# Patient Record
Sex: Female | Born: 1985 | Race: White | Hispanic: No | Marital: Single | Smoking: Former smoker
Health system: Southern US, Community
[De-identification: ages and names within clinical notes are randomized; demographics above are authoritative.]

## PROBLEM LIST (undated history)

## (undated) DIAGNOSIS — F419 Anxiety disorder, unspecified: Secondary | ICD-10-CM

---
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
## 2007-11-26 ENCOUNTER — Emergency Department: Payer: Self-pay | Admitting: Unknown Physician Specialty

## 2009-01-02 ENCOUNTER — Ambulatory Visit: Payer: Self-pay | Admitting: Family Medicine

## 2009-05-07 ENCOUNTER — Inpatient Hospital Stay: Payer: Self-pay | Admitting: Obstetrics & Gynecology

## 2011-04-04 ENCOUNTER — Ambulatory Visit: Payer: Self-pay | Admitting: General Surgery

## 2014-11-01 ENCOUNTER — Emergency Department
Admit: 2014-11-01 | Disposition: A | Discharge status: Home / Self Care | Payer: Self-pay | Admitting: Emergency Medicine

## 2014-11-01 LAB — COMPREHENSIVE METABOLIC PANEL
ANION GAP: 7 (ref 7–16)
Albumin: 4.5 g/dL
Alkaline Phosphatase: 46 U/L
BUN: 10 mg/dL
CO2: 23 mmol/L
CREATININE: 0.76 mg/dL
Calcium, Total: 9.2 mg/dL
Chloride: 108 mmol/L
EGFR (African American): >60
EGFR (Non-African Amer.): >60
GLUCOSE: 89 mg/dL
Potassium: 4 mmol/L
SGOT(AST): 19 U/L
SGPT (ALT): 10 U/L — ABNORMAL LOW
SODIUM: 138 mmol/L
Total Protein: 7.2 g/dL

## 2014-11-01 LAB — CBC WITH DIFFERENTIAL/PLATELET
Basophil #: 0 10*3/uL (ref 0.0–0.1)
Basophil %: 0.7 %
Eosinophil #: 0.2 10*3/uL (ref 0.0–0.7)
Eosinophil %: 2.5 %
HCT: 35.7 % (ref 35.0–47.0)
HGB: 11.8 g/dL — ABNORMAL LOW (ref 12.0–16.0)
LYMPHS PCT: 39.4 %
Lymphocyte #: 2.6 10*3/uL (ref 1.0–3.6)
MCH: 27.5 pg (ref 26.0–34.0)
MCHC: 32.9 g/dL (ref 32.0–36.0)
MCV: 84 fL (ref 80–100)
Monocyte #: 0.5 x10 3/mm (ref 0.2–0.9)
Monocyte %: 7.3 %
NEUTROS PCT: 50.1 %
Neutrophil #: 3.3 10*3/uL (ref 1.4–6.5)
Platelet: 176 10*3/uL (ref 150–440)
RBC: 4.27 10*6/uL (ref 3.80–5.20)
RDW: 14.9 % — ABNORMAL HIGH (ref 11.5–14.5)
WBC: 6.5 10*3/uL (ref 3.6–11.0)

## 2014-11-01 LAB — URINALYSIS, COMPLETE
BACTERIA: NONE SEEN
BLOOD: NEGATIVE
Bilirubin,UR: NEGATIVE
GLUCOSE, UR: NEGATIVE mg/dL (ref 0–75)
LEUKOCYTE ESTERASE: NEGATIVE
Nitrite: NEGATIVE
PROTEIN: NEGATIVE
Ph: 5 (ref 4.5–8.0)
Specific Gravity: 1.021 (ref 1.003–1.030)

## 2014-12-21 ENCOUNTER — Emergency Department: Payer: Self-pay

## 2014-12-21 ENCOUNTER — Emergency Department
Admission: EM | Admit: 2014-12-21 | Discharge: 2014-12-21 | Disposition: A | Discharge status: Home / Self Care | Payer: Self-pay | Attending: Emergency Medicine | Admitting: Emergency Medicine

## 2014-12-21 ENCOUNTER — Other Ambulatory Visit: Payer: Self-pay

## 2014-12-21 DIAGNOSIS — Z79899 Other long term (current) drug therapy: Secondary | ICD-10-CM | POA: Insufficient documentation

## 2014-12-21 DIAGNOSIS — Z3202 Encounter for pregnancy test, result negative: Secondary | ICD-10-CM | POA: Insufficient documentation

## 2014-12-21 DIAGNOSIS — G8929 Other chronic pain: Secondary | ICD-10-CM | POA: Insufficient documentation

## 2014-12-21 DIAGNOSIS — E876 Hypokalemia: Secondary | ICD-10-CM | POA: Insufficient documentation

## 2014-12-21 DIAGNOSIS — R1032 Left lower quadrant pain: Secondary | ICD-10-CM | POA: Insufficient documentation

## 2014-12-21 HISTORY — DX: Anxiety disorder, unspecified: F41.9

## 2014-12-21 LAB — COMPREHENSIVE METABOLIC PANEL
ALK PHOS: 55 U/L (ref 38–126)
ALT: 10 U/L — ABNORMAL LOW (ref 14–54)
ANION GAP: 14 (ref 5–15)
AST: 23 U/L (ref 15–41)
Albumin: 4.6 g/dL (ref 3.5–5.0)
BUN: 11 mg/dL (ref 6–20)
CO2: 21 mmol/L — AB (ref 22–32)
Calcium: 9.8 mg/dL (ref 8.9–10.3)
Chloride: 102 mmol/L (ref 101–111)
Creatinine, Ser: 0.76 mg/dL (ref 0.44–1.00)
GFR calc Af Amer: >60 mL/min (ref >60)
Glucose, Bld: 162 mg/dL — ABNORMAL HIGH (ref 65–99)
POTASSIUM: 2.7 mmol/L — AB (ref 3.5–5.1)
Sodium: 137 mmol/L (ref 135–145)
TOTAL PROTEIN: 8.1 g/dL (ref 6.5–8.1)
Total Bilirubin: 0.5 mg/dL (ref 0.3–1.2)

## 2014-12-21 LAB — URINALYSIS COMPLETE WITH MICROSCOPIC (ARMC ONLY)
Bilirubin Urine: NEGATIVE
GLUCOSE, UA: NEGATIVE mg/dL
HGB URINE DIPSTICK: NEGATIVE
Leukocytes, UA: NEGATIVE
NITRITE: NEGATIVE
PROTEIN: NEGATIVE mg/dL
RBC / HPF: NONE SEEN RBC/hpf (ref 0–5)
Specific Gravity, Urine: 1.004 — ABNORMAL LOW (ref 1.005–1.030)
WBC, UA: NONE SEEN WBC/hpf (ref 0–5)
pH: 6 (ref 5.0–8.0)

## 2014-12-21 LAB — CBC WITH DIFFERENTIAL/PLATELET
Basophils Absolute: 0 10*3/uL (ref 0–0.1)
Basophils Relative: 1 %
EOS ABS: 0.1 10*3/uL (ref 0–0.7)
Eosinophils Relative: 2 %
HEMATOCRIT: 34.3 % — AB (ref 35.0–47.0)
HEMOGLOBIN: 11.3 g/dL — AB (ref 12.0–16.0)
Lymphocytes Relative: 39 %
Lymphs Abs: 2.8 10*3/uL (ref 1.0–3.6)
MCH: 27.4 pg (ref 26.0–34.0)
MCHC: 32.9 g/dL (ref 32.0–36.0)
MCV: 83.2 fL (ref 80.0–100.0)
MONO ABS: 0.5 10*3/uL (ref 0.2–0.9)
MONOS PCT: 7 %
Neutro Abs: 3.7 10*3/uL (ref 1.4–6.5)
Neutrophils Relative %: 51 %
PLATELETS: 189 10*3/uL (ref 150–440)
RBC: 4.12 MIL/uL (ref 3.80–5.20)
RDW: 15.8 % — AB (ref 11.5–14.5)
WBC: 7.1 10*3/uL (ref 3.6–11.0)

## 2014-12-21 LAB — LIPASE, BLOOD: Lipase: 33 U/L (ref 22–51)

## 2014-12-21 LAB — POCT PREGNANCY, URINE: Preg Test, Ur: NEGATIVE

## 2014-12-21 MED ORDER — SODIUM CHLORIDE 0.9 % IV BOLUS (SEPSIS)
1000.0000 mL | Freq: Once | INTRAVENOUS | Status: AC
  Administered 2014-12-21: 1000 mL via INTRAVENOUS

## 2014-12-21 MED ORDER — MORPHINE SULFATE 4 MG/ML IJ SOLN: 4.0000 mg | Freq: Once | INTRAMUSCULAR | Status: DC

## 2014-12-21 MED ORDER — ONDANSETRON HCL 4 MG/2ML IJ SOLN
INTRAMUSCULAR | Status: AC
Start: 2014-12-21 — End: 2014-12-21
  Administered 2014-12-21: 4 mg via INTRAVENOUS
  Filled 2014-12-21: qty 2

## 2014-12-21 MED ORDER — MORPHINE SULFATE 4 MG/ML IJ SOLN
4.0000 mg | Freq: Once | INTRAMUSCULAR | Status: AC
  Administered 2014-12-21: 4 mg via INTRAVENOUS

## 2014-12-21 MED ORDER — ONDANSETRON HCL 4 MG/2ML IJ SOLN: 4.0000 mg | Freq: Once | INTRAMUSCULAR | Status: DC

## 2014-12-21 MED ORDER — MORPHINE SULFATE 4 MG/ML IJ SOLN
INTRAMUSCULAR | Status: AC
  Administered 2014-12-21: 4 mg via INTRAVENOUS
  Filled 2014-12-21: qty 1

## 2014-12-21 MED ORDER — POTASSIUM CHLORIDE CRYS ER 20 MEQ PO TBCR
EXTENDED_RELEASE_TABLET | ORAL | Status: AC
  Administered 2014-12-21: 40 meq via ORAL
  Filled 2014-12-21: qty 2

## 2014-12-21 MED ORDER — ONDANSETRON HCL 4 MG/2ML IJ SOLN
4.0000 mg | Freq: Once | INTRAMUSCULAR | Status: AC
  Administered 2014-12-21: 4 mg via INTRAVENOUS

## 2014-12-21 MED ORDER — POTASSIUM CHLORIDE CRYS ER 20 MEQ PO TBCR
40.0000 meq | EXTENDED_RELEASE_TABLET | Freq: Once | ORAL | Status: AC
  Administered 2014-12-21: 40 meq via ORAL

## 2014-12-21 MED ORDER — POTASSIUM CHLORIDE CRYS ER 20 MEQ PO TBCR: 20.0000 meq | EXTENDED_RELEASE_TABLET | Freq: Every day | ORAL | Status: DC

## 2014-12-21 NOTE — Discharge Instructions (Signed)
You were evaluated for left lower abdominal pain for which no certain cause was found however your exam and evaluation are reassuring. We discussed return to the emergency department for fever, vomiting, black or bloody stools, new or worsening abdominal pain. We discussed trying elimination diet including dairy and gluten for 3-4 weeks to see if this gives her any relief. We discussed following up with [facility] clinic as scheduled. We discussed you may need to see a GI specialist, and he may want to try to go to a [facility] clinic due to financial reasons.  Your potassium level was low and you're being given a supplement for 2 more days. You should have your electrolytes and potassium rechecked at your follow-up appointment. Return to the emergency department for any tingling, weakness, chest pain, palpitations, or trouble breathing.  Abdominal Pain Many things can cause abdominal pain. Usually, abdominal pain is not caused by a disease and will improve without treatment. It can often be observed and treated at home. Your health care provider will do a physical exam and possibly order blood tests and X-rays to help determine the seriousness of your pain. However, in many cases, more time must pass before a clear cause of the pain can be found. Before that point, your health care provider may not know if you need more testing or further treatment. HOME CARE INSTRUCTIONS  Monitor your abdominal pain for any changes. The following actions may help to alleviate any discomfort you are experiencing:  Only take over-the-counter or prescription medicines as directed by your health care provider.  Do not take laxatives unless directed to do so by your health care provider.  Try a clear liquid diet (broth, tea, or water) as directed by your health care provider. Slowly move to a bland diet as tolerated. SEEK MEDICAL CARE IF:  You have unexplained abdominal pain.  You have abdominal pain associated with nausea or  diarrhea.  You have pain when you urinate or have a bowel movement.  You experience abdominal pain that wakes you in the night.  You have abdominal pain that is worsened or improved by eating food.  You have abdominal pain that is worsened with eating fatty foods.  You have a fever. SEEK IMMEDIATE MEDICAL CARE IF:   Your pain does not go away within 2 hours.  You keep throwing up (vomiting).  Your pain is felt only in portions of the abdomen, such as the right side or the left lower portion of the abdomen.  You pass bloody or black tarry stools. MAKE SURE YOU:  Understand these instructions.   Will watch your condition.   Will get help right away if you are not doing well or get worse.  Document Released: 04/09/2005 Document Revised: 07/05/2013 Document Reviewed: 03/09/2013 [facility] Patient Information 2015 [place], [place]. This information is not intended to replace advice given to you by your health care provider. Make sure you discuss any questions you have with your health care provider.  Hypokalemia Hypokalemia means that the amount of potassium in the blood is lower than normal.Potassium is a chemical, called an electrolyte, that helps regulate the amount of fluid in the body. It also stimulates muscle contraction and helps nerves function properly.Most of the body's potassium is inside of cells, and only a very small amount is in the blood. Because the amount in the blood is so small, minor changes can be life-threatening. CAUSES  Antibiotics.  Diarrhea or vomiting.  Using laxatives too much, which can cause diarrhea.  Chronic kidney disease.  Water pills (diuretics).  Eating disorders (bulimia).  Low magnesium level.  Sweating a lot. SIGNS AND SYMPTOMS  Weakness.  Constipation.  Fatigue.  Muscle cramps.  Mental confusion.  Skipped heartbeats or irregular heartbeat (palpitations).  Tingling or numbness. DIAGNOSIS  Your health care provider  can diagnose hypokalemia with blood tests. In addition to checking your potassium level, your health care provider may also check other lab tests. TREATMENT Hypokalemia can be treated with potassium supplements taken by mouth or adjustments in your current medicines. If your potassium level is very low, you may need to get potassium through a vein (IV) and be monitored in the hospital. A diet high in potassium is also helpful. Foods high in potassium are:  Nuts, such as peanuts and pistachios.  Seeds, such as sunflower seeds and pumpkin seeds.  Peas, lentils, and lima beans.  Whole grain and bran cereals and breads.  Fresh fruit and vegetables, such as apricots, avocado, bananas, cantaloupe, kiwi, oranges, tomatoes, asparagus, and potatoes.  Orange and tomato juices.  Red meats.  Fruit yogurt. HOME CARE INSTRUCTIONS  Take all medicines as prescribed by your health care provider.  Maintain a healthy diet by including nutritious food, such as fruits, vegetables, nuts, whole grains, and lean meats.  If you are taking a laxative, be sure to follow the directions on the label. SEEK MEDICAL CARE IF:  Your weakness gets worse.  You feel your heart pounding or racing.  You are vomiting or having diarrhea.  You are diabetic and having trouble keeping your blood glucose in the normal range. SEEK IMMEDIATE MEDICAL CARE IF:  You have chest pain, shortness of breath, or dizziness.  You are vomiting or having diarrhea for more than 2 days.  You faint. MAKE SURE YOU:   Understand these instructions.  Will watch your condition.  Will get help right away if you are not doing well or get worse. Document Released: 06/30/2005 Document Revised: 04/20/2013 Document Reviewed: 12/31/2012 [facility] Patient Information 2015 [place], [place]. This information is not intended to replace advice given to you by your health care provider. Make sure you discuss any questions you have with your health  care provider.

## 2014-12-21 NOTE — ED Provider Notes (Addendum)
[facility] Emergency Department Provider Note   ____________________________________________  Time seen: 6:10 PM I have reviewed the triage vital signs and the triage nursing note.  HISTORY  Chief Complaint Abdominal Pain   Historian Patient and mother  HPI Terry Wilkerson is a 29 y.o. female who is complaining of sharp and tingling left lower quadrant pain which started about 30 minutes ago. She has had waxing and waning chronic left sided abdominal pain for about one month and was seen once in the emergency department before and has an appointment scheduled at [facility] clinic.30 minutes prior to arrival today though became sharp and more severe than ever previously. She has no problems with chronic nausea, diarrhea, or constipation.    Past Medical History  Diagnosis Date  . Anxiety     There are no active problems to display for this patient.   Past Surgical History  Procedure Laterality Date  . Cesarean section      Current Outpatient Rx  Name  Route  Sig  Dispense  Refill  . potassium chloride SA (K-DUR,KLOR-CON) 20 MEQ tablet   Oral   Take 1 tablet (20 mEq total) by mouth daily.   2 tablet   0     Allergies Review of patient's allergies indicates no known allergies.  History reviewed. No pertinent family history.  Social History History  Substance Use Topics  . Smoking status: Never Smoker   . Smokeless tobacco: Not on file  . Alcohol Use: No    Review of Systems  Constitutional: Negative for fever. Eyes: Negative for visual changes. ENT: Negative for sore throat. Cardiovascular: Negative for chest pain. Respiratory: Negative for shortness of breath. Gastrointestinal: Negative for vomiting and diarrhea. Genitourinary: Negative for dysuria. Musculoskeletal: Negative for back pain. Skin: Negative for rash. Neurological: Negative for headaches, focal weakness or  numbness.  ____________________________________________   PHYSICAL EXAM:  VITAL SIGNS: ED Triage Vitals  Enc Vitals Group     BP --      Pulse --      Resp --      Temp --      Temp src --      SpO2 --      Weight --      Height --      Head Cir --      Peak Flow --      Pain Score 12/21/14 1808 10     Pain Loc --      Pain Edu? --      Excl. in GC? --      Constitutional: Alert and oriented. Shaking and tearful due to acute pain Eyes: Conjunctivae are normal. PERRL. Normal extraocular movements. ENT   Head: Normocephalic and atraumatic.   Nose: No congestion/rhinnorhea.   Mouth/Throat: Mucous membranes are moist.   Neck: No stridor. Cardiovascular: Normal rate, regular rhythm.  No murmurs, rubs, or gallops. Respiratory: Normal respiratory effort without tachypnea nor retractions. Breath sounds are clear and equal bilaterally. No wheezes/rales/rhonchi. Gastrointestinal: Soft. No distention, no guarding, no rebound. Mild to moderate tenderness in the left lower quadrant on palpation  Genitourinary/rectal: Deferred Musculoskeletal: Nontender with normal range of motion in all extremities. No joint effusions.  No lower extremity tenderness nor edema. Neurologic:  Normal speech and language. No gross focal neurologic deficits are appreciated. Skin:  Skin is warm, dry and intact. No rash noted. Psychiatric: Mood and affect are normal. Speech and behavior are normal. Patient exhibits appropriate insight and judgment.  ____________________________________________  EKG I, Governor Rooks, MD, the attending physician have personally viewed and interpreted this ECG.   97 bpm. Normal sinus rhythm. Narrow QS. Normal axis. Nonspecific T wave. ____________________________________________  LABS (pertinent positives/negatives)  Urine pregnancy test negative Lipase 33 Metabolic panel showing glucose 162, CO2 21, potassium 2.7 White blood count 7.1 and hemoglobin  11.3 Urinalysis: Trace ketones, rare bacteria with no red blood cells white blood cells or leukocytes. ____________________________________________  RADIOLOGY Radiologist results reviewed  Ultrasound non-OB transvaginal and pelvis: IMPRESSION: Negative for ovarian torsion. Normal physiologic appearance of the uterus and ovaries. __________________________________________  PROCEDURES  Procedure(s) performed: None Critical Care performed: None  ____________________________________________   ED COURSE / ASSESSMENT AND PLAN  Pertinent labs & imaging results that were available during my care of the patient were reviewed by me and considered in my medical decision making (see chart for details).  Patient's abdominal pain was much improved after pain medication. She was treated for hypokalemia. On examination of her abdomen, everything is soft, did not feel any masses. I do not see any indication for emergency CT evaluation of the abdomen and I discussed this with the family. They're comfortable with this plan. There have and a follow-up plan at the [facility]. We discussed symptoms are likely related to the bowels, and we discussed perhaps trying limitation diet while waiting for her follow-up appointment. We also discussed due to financial reasons getting into a GI specialist here can be cost prohibitive, and so there going to look into going to [facility] clinics. No abnormal vaginal bleeding or vaginal discharge or pelvic pain, pelvic exam was deferred.   ___________________________________________   FINAL CLINICAL IMPRESSION(S) / ED DIAGNOSES   Final diagnoses:  LLQ pain  Hypokalemia      Governor Rooks, MD 12/21/14 2121  Governor Rooks, MD 12/21/14 2122

## 2014-12-21 NOTE — ED Notes (Signed)
Developed lower abd pain about 30 mins PTA  Pt is very anxious

## 2014-12-21 NOTE — ED Notes (Signed)
[place] notified of negative pregnancy results.

## 2014-12-21 NOTE — ED Notes (Signed)
Dr. Shaune Pollack notified of potassium result.

## 2016-02-04 ENCOUNTER — Emergency Department
Admission: EM | Admit: 2016-02-04 | Discharge: 2016-02-04 | Disposition: A | Discharge status: Home / Self Care | Payer: Self-pay | Attending: Emergency Medicine | Admitting: Emergency Medicine

## 2016-02-04 ENCOUNTER — Emergency Department: Payer: Self-pay

## 2016-02-04 ENCOUNTER — Encounter: Payer: Self-pay | Admitting: Emergency Medicine

## 2016-02-04 DIAGNOSIS — R079 Chest pain, unspecified: Secondary | ICD-10-CM

## 2016-02-04 DIAGNOSIS — F419 Anxiety disorder, unspecified: Secondary | ICD-10-CM | POA: Insufficient documentation

## 2016-02-04 LAB — CBC
HCT: 38.8 % (ref 35.0–47.0)
Hemoglobin: 12.6 g/dL (ref 12.0–16.0)
MCH: 27.6 pg (ref 26.0–34.0)
MCHC: 32.5 g/dL (ref 32.0–36.0)
MCV: 84.8 fL (ref 80.0–100.0)
PLATELETS: 186 10*3/uL (ref 150–440)
RBC: 4.58 MIL/uL (ref 3.80–5.20)
RDW: 15.2 % — ABNORMAL HIGH (ref 11.5–14.5)
WBC: 4.9 10*3/uL (ref 3.6–11.0)

## 2016-02-04 LAB — BASIC METABOLIC PANEL
Anion gap: 9 (ref 5–15)
BUN: 13 mg/dL (ref 6–20)
CHLORIDE: 105 mmol/L (ref 101–111)
CO2: 25 mmol/L (ref 22–32)
CREATININE: 0.61 mg/dL (ref 0.44–1.00)
Calcium: 9.4 mg/dL (ref 8.9–10.3)
GFR calc non Af Amer: >60 mL/min (ref >60)
Glucose, Bld: 94 mg/dL (ref 65–99)
POTASSIUM: 3.6 mmol/L (ref 3.5–5.1)
SODIUM: 139 mmol/L (ref 135–145)

## 2016-02-04 LAB — TROPONIN I: Troponin I: <0.03 ng/mL (ref <0.03)

## 2016-02-04 NOTE — Discharge Instructions (Signed)
You were evaluated for chest discomfort, and although no certain cause was found, your exam and evaluation are reassuring today in the emergency department.  I discussed, I suspect that anxiety may be playing a significant role.  Return to the emergency department for any new or worsening condition including trouble breathing, shortness of breath, fever, coughing, new or worsening chest pain, dizziness or passing out, or any other symptoms concerning to you.

## 2016-02-04 NOTE — ED Triage Notes (Signed)
Pt c/o chest pain under left breast for one week, worse when she takes a deep breath.

## 2016-02-04 NOTE — ED Provider Notes (Signed)
[facility] Emergency Department Provider Note   ____________________________________________  Time seen:  I have reviewed the triage vital signs and the triage nursing note.  HISTORY  Chief Complaint Chest Pain   Historian Patient  HPI Terry Wilkerson is a 30 y.o. female with a history of anxiety is here with about 3 days worth of left sided chest pain that is somewhat sharp and located under the left breast.  No shortness of breath. No pleuritic component to the chest discomfort. Patient states that she initially thought it was probably anxiety, but because it persisted, she started getting a little bit more concerned and wanted to get checked out.  No nausea or sweats. No fevers. No abdominal pain. Symptoms are currently mild. Nothing seems to make it worse or better.    Past Medical History:  Diagnosis Date  . Anxiety     There are no active problems to display for this patient.   Past Surgical History:  Procedure Laterality Date  . CESAREAN SECTION      Current Outpatient Rx  . Order #: [id number] Class: Print    Allergies Ibuprofen  No family history on file.  Social History Social History  Substance Use Topics  . Smoking status: Never Smoker  . Smokeless tobacco: Never Used  . Alcohol use No    Review of Systems  Constitutional: Negative for fever. Eyes: Negative for visual changes. ENT: Negative for sore throat. Cardiovascular: No palpitations Respiratory: Negative for shortness of breath. Gastrointestinal: Negative for abdominal pain, vomiting and diarrhea. Genitourinary: Negative for dysuria. Musculoskeletal: Negative for back pain. Skin: Negative for rash. Neurological: Negative for headache. 10 point Review of Systems otherwise negative ____________________________________________   PHYSICAL EXAM:  VITAL SIGNS: ED Triage Vitals  Enc Vitals Group     BP 02/04/16 1150 124/85     Pulse Rate 02/04/16 1150 (!) 104      Resp 02/04/16 1150 20     Temp 02/04/16 1150 98.4 F (36.9 C)     Temp Source 02/04/16 1150 Oral     SpO2 02/04/16 1150 100 %     Weight 02/04/16 1150 114 lb (51.7 kg)     Height 02/04/16 1150  (1.6 m)     Head Circumference --      Peak Flow --      Pain Score 02/04/16 1157 6     Pain Loc --      Pain Edu? --      Excl. in GC? --      Constitutional: Alert and oriented. Well appearing and in no distress. HEENT   Head: Normocephalic and atraumatic.      Eyes: Conjunctivae are normal. PERRL. Normal extraocular movements.      Ears:         Nose: No congestion/rhinnorhea.   Mouth/Throat: Mucous membranes are moist.   Neck: No stridor. Cardiovascular/Chest: Normal rate, regular rhythm.  No murmurs, rubs, or gallops. Respiratory: Normal respiratory effort without tachypnea nor retractions. Breath sounds are clear and equal bilaterally. No wheezes/rales/rhonchi. Gastrointestinal: Soft. No distention, no guarding, no rebound. Nontender.    Genitourinary/rectal:Deferred Musculoskeletal: Nontender with normal range of motion in all extremities. No joint effusions.  No lower extremity tenderness.  No edema. Neurologic:  Normal speech and language. No gross or focal neurologic deficits are appreciated. Skin:  Skin is warm, dry and intact. No rash noted. Psychiatric: Mood and affect are normal. Speech and behavior are normal. Patient exhibits appropriate insight and judgment.  ____________________________________________  EKG I, Governor Rooks, MD, the attending physician have personally viewed and interpreted all ECGs.  93 bpm. Normal sinus rhythm. Narrow QRS. Normal axis. Nonspecific ST and T-wave ____________________________________________  LABS (pertinent positives/negatives)  Labs Reviewed  CBC - Abnormal; Notable for the following:       Result Value   RDW 15.2 (*)    All other components within normal limits  BASIC METABOLIC PANEL  TROPONIN I     ____________________________________________  RADIOLOGY All Xrays were viewed by me. Imaging interpreted by Radiologist.  Chest: IMPRESSION: No edema or consolidation. Electronically Signed   By: Bretta Bang III M.D __________________________________________  PROCEDURES  Procedure(s) performed: None  Critical Care performed: None  ____________________________________________   ED COURSE / ASSESSMENT AND PLAN  Pertinent labs & imaging results that were available during my care of the patient were reviewed by me and considered in my medical decision making (see chart for details).   This is a well-appearing young patient with symptoms of nonspecific chest discomfort that did not sound convincing for ACS, pulmonary emergency including PE, or abdominal emergency.    Her EKG and labs are reassuring. She has had symptoms ongoing for a couple of days now and her initial troponin is negative. I don't think she needs a repeat troponin.  Patient reports that she does have anxiety, and I will go ahead and refer her both to see primary care physician as well as mental health evaluation.  She does not have an emergency indication for emergency psychiatric evaluation or involuntary commitment.    CONSULTATIONS:   None   Patient / Family / Caregiver informed of clinical course, medical decision-making process, and agree with plan.   I discussed return precautions, follow-up instructions, and discharged instructions with patient and/or family.   ___________________________________________   FINAL CLINICAL IMPRESSION(S) / ED DIAGNOSES   Final diagnoses:  Nonspecific chest pain  Anxiety              Note: This dictation was prepared with Dragon dictation. Any transcriptional errors that result from this process are unintentional    Governor Rooks, MD 02/04/16 1428

## 2016-04-30 ENCOUNTER — Emergency Department
Admission: EM | Admit: 2016-04-30 | Discharge: 2016-04-30 | Disposition: A | Discharge status: Home / Self Care | Payer: Self-pay | Attending: Emergency Medicine | Admitting: Emergency Medicine

## 2016-04-30 ENCOUNTER — Emergency Department: Payer: Self-pay

## 2016-04-30 ENCOUNTER — Encounter: Payer: Self-pay | Admitting: *Deleted

## 2016-04-30 DIAGNOSIS — R0789 Other chest pain: Secondary | ICD-10-CM | POA: Insufficient documentation

## 2016-04-30 LAB — CBC
HCT: 38.2 % (ref 35.0–47.0)
Hemoglobin: 13 g/dL (ref 12.0–16.0)
MCH: 29.4 pg (ref 26.0–34.0)
MCHC: 34 g/dL (ref 32.0–36.0)
MCV: 86.4 fL (ref 80.0–100.0)
PLATELETS: 184 10*3/uL (ref 150–440)
RBC: 4.42 MIL/uL (ref 3.80–5.20)
RDW: 14.7 % — AB (ref 11.5–14.5)
WBC: 4.2 10*3/uL (ref 3.6–11.0)

## 2016-04-30 LAB — BASIC METABOLIC PANEL
Anion gap: 8 (ref 5–15)
BUN: 9 mg/dL (ref 6–20)
CHLORIDE: 104 mmol/L (ref 101–111)
CO2: 26 mmol/L (ref 22–32)
CREATININE: 0.73 mg/dL (ref 0.44–1.00)
Calcium: 9.7 mg/dL (ref 8.9–10.3)
Glucose, Bld: 107 mg/dL — ABNORMAL HIGH (ref 65–99)
POTASSIUM: 3.8 mmol/L (ref 3.5–5.1)
SODIUM: 138 mmol/L (ref 135–145)

## 2016-04-30 LAB — TROPONIN I: Troponin I: <0.03 ng/mL (ref <0.03)

## 2016-04-30 NOTE — ED Notes (Signed)
Chest tightness intermittently X 1 week, worse while lying down. Pt alert and oriented X4, active, cooperative, pt in NAD. RR even and unlabored, color WNL.

## 2016-04-30 NOTE — ED Notes (Signed)
Pt states understanding of discharge instructions. NAD noted at this time.  

## 2016-04-30 NOTE — ED Triage Notes (Signed)
States chest tightness for 1 week, states pressure is worse when lying down, pt awake and alert in no acute distress

## 2016-04-30 NOTE — ED Notes (Signed)
MD at bedside. 

## 2016-04-30 NOTE — ED Provider Notes (Signed)
[facility] Emergency Department Provider Note   ____________________________________________    I have reviewed the triage vital signs and the nursing notes.   HISTORY  Chief Complaint Chest Pain   HPI Terry Wilkerson is a 30 y.o. female who presents with chest discovered. Patient reports she has aching chest discomfort under her left breast which is worse with pressure. She describes it as mild to moderate. She notes it has been occurring over the last 3 months constantly. She denies pleurisy. No shortness of breath. No history of blood clots. No diaphoresis. No radiation of the pain. No injury to the area.   Past Medical History:  Diagnosis Date  . Anxiety     There are no active problems to display for this patient.   Past Surgical History:  Procedure Laterality Date  . CESAREAN SECTION      Prior to Admission medications   Medication Sig Start Date End Date Taking? Authorizing Provider  potassium chloride SA (K-DUR,KLOR-CON) 20 MEQ tablet Take 1 tablet (20 mEq total) by mouth daily. 12/21/14   Governor Rooksebecca Lord, MD     Allergies Ibuprofen  History reviewed. No pertinent family history.  Social History Social History  Substance Use Topics  . Smoking status: Never Smoker  . Smokeless tobacco: Never Used  . Alcohol use No    Review of Systems  Constitutional: No fever/chills    Cardiovascular: As above Respiratory: Denies shortness of breath. Gastrointestinal: No abdominal pain.      Musculoskeletal: Negative for back pain. Skin: Negative for rash. Neurological: Negative for weakness  10-point ROS otherwise negative.  ____________________________________________   PHYSICAL EXAM:  VITAL SIGNS: ED Triage Vitals [04/30/16 1330]  Enc Vitals Group     BP (!) 132/91     Pulse Rate 98     Resp 18     Temp 98.4 F (36.9 C)     Temp Source Oral     SpO2 100 %     Weight 118 lb (53.5 kg)     Height 5\' 3"  (1.6 m)     Head  Circumference      Peak Flow      Pain Score 6     Pain Loc      Pain Edu?      Excl. in GC?     Constitutional: Alert and oriented. No acute distress. Pleasant and interactive Eyes: Conjunctivae are normal.     Cardiovascular: Normal rate, regular rhythm. Grossly normal heart sounds.  Good peripheral circulation. Patient has tenderness to palpation on the lateral inferior border of the sternum, no swelling, erythema, bruising, bony abnormalities Respiratory: Normal respiratory effort.  No retractions. Lungs CTAB. Gastrointestinal: Soft and nontender. No distention.  No CVA tenderness. Genitourinary: deferred Musculoskeletal: No lower extremity tenderness nor edema.  Warm and well perfused Neurologic:  Normal speech and language.  Skin:  Skin is warm, dry and intact. No rash noted. Psychiatric: Mood and affect are normal. Speech and behavior are normal.  ____________________________________________   LABS (all labs ordered are listed, but only abnormal results are displayed)  Labs Reviewed  BASIC METABOLIC PANEL - Abnormal; Notable for the following:       Result Value   Glucose, Bld 107 (*)    All other components within normal limits  CBC - Abnormal; Notable for the following:    RDW 14.7 (*)    All other components within normal limits  TROPONIN I   ____________________________________________  EKG  ED  ECG REPORT I, Jene Every, the attending physician, personally viewed and interpreted this ECG.  Date: 04/30/2016 EKG Time: 1:37 PM Rate: 86 Rhythm: normal sinus rhythm QRS Axis: normal Intervals: normal ST/T Wave abnormalities: normal Conduction Disturbances: none   ____________________________________________  RADIOLOGY  Chest x-ray remarkable ____________________________________________   PROCEDURES  Procedure(s) performed: No    Critical Care performed:No ____________________________________________   INITIAL IMPRESSION / ASSESSMENT AND  PLAN / ED COURSE  Pertinent labs & imaging results that were available during my care of the patient were reviewed by me and considered in my medical decision making (see chart for details).  Patient presents with 3 months of left-sided chest wall tenderness. Her lab work is reassuring, her history of present illness is most consistent with costochondritis versus chest wall pain unspecified. Her chest x-ray is normal. Recommend trial of NSAIDs and follow-up with PCP. Return precautions discussed.  Clinical Course   ____________________________________________   FINAL CLINICAL IMPRESSION(S) / ED DIAGNOSES  Final diagnoses:  Chest wall pain      NEW MEDICATIONS STARTED DURING THIS VISIT:  Discharge Medication List as of 04/30/2016  3:20 PM       Note:  This document was prepared using Dragon voice recognition software and may include unintentional dictation errors.    Jene Every, MD 04/30/16 [id number]

## 2016-05-02 ENCOUNTER — Emergency Department (HOSPITAL_COMMUNITY)
Admission: EM | Admit: 2016-05-02 | Discharge: 2016-05-02 | Disposition: A | Discharge status: Home / Self Care | Payer: Self-pay | Attending: Emergency Medicine | Admitting: Emergency Medicine

## 2016-05-02 ENCOUNTER — Emergency Department (HOSPITAL_COMMUNITY): Payer: Self-pay

## 2016-05-02 ENCOUNTER — Encounter (HOSPITAL_COMMUNITY): Payer: Self-pay

## 2016-05-02 DIAGNOSIS — R0789 Other chest pain: Secondary | ICD-10-CM | POA: Insufficient documentation

## 2016-05-02 LAB — CBC
HEMATOCRIT: 36.8 % (ref 36.0–46.0)
Hemoglobin: 12.3 g/dL (ref 12.0–15.0)
MCH: 28.8 pg (ref 26.0–34.0)
MCHC: 33.4 g/dL (ref 30.0–36.0)
MCV: 86.2 fL (ref 78.0–100.0)
PLATELETS: 217 10*3/uL (ref 150–400)
RBC: 4.27 MIL/uL (ref 3.87–5.11)
RDW: 13.3 % (ref 11.5–15.5)
WBC: 4.9 10*3/uL (ref 4.0–10.5)

## 2016-05-02 LAB — BASIC METABOLIC PANEL
Anion gap: 10 (ref 5–15)
BUN: 8 mg/dL (ref 6–20)
CHLORIDE: 104 mmol/L (ref 101–111)
CO2: 24 mmol/L (ref 22–32)
CREATININE: 0.77 mg/dL (ref 0.44–1.00)
Calcium: 9.7 mg/dL (ref 8.9–10.3)
GFR calc Af Amer: >60 mL/min (ref >60)
GFR calc non Af Amer: >60 mL/min (ref >60)
Glucose, Bld: 137 mg/dL — ABNORMAL HIGH (ref 65–99)
POTASSIUM: 3.3 mmol/L — AB (ref 3.5–5.1)
Sodium: 138 mmol/L (ref 135–145)

## 2016-05-02 LAB — I-STAT BETA HCG BLOOD, ED (MC, WL, AP ONLY)

## 2016-05-02 LAB — I-STAT TROPONIN, ED: Troponin i, poc: 0 ng/mL (ref 0.00–0.08)

## 2016-05-02 LAB — D-DIMER, QUANTITATIVE: D-Dimer, Quant: 0.32 ug/mL-FEU (ref 0.00–0.50)

## 2016-05-02 MED ORDER — ACETAMINOPHEN 500 MG PO TABS
1000.0000 mg | ORAL_TABLET | Freq: Once | ORAL | Status: AC
  Administered 2016-05-02: 1000 mg via ORAL
  Filled 2016-05-02: qty 2

## 2016-05-02 NOTE — ED Notes (Signed)
Pt states chest pain is 10/10.  Pt updated that she is getting ready to go to treatment room.

## 2016-05-02 NOTE — ED Provider Notes (Signed)
[facility]-EMERGENCY DEPT Provider Note   CSN: [id number] Arrival date & time: 05/02/16  1903     History   Chief Complaint Chief Complaint  Patient presents with  . Chest Pain    HPI Terry Wilkerson is a 30 y.o. female.  The history is provided by the patient. No language interpreter was used.  Chest Pain   This is a recurrent problem. The current episode started 6 to 12 hours ago. The problem occurs constantly. The problem has been gradually worsening. The pain is associated with rest. The pain is present in the substernal region. The pain is at a severity of 8/10. The pain is moderate. The quality of the pain is described as vice-like (tightness). The pain radiates to the left arm. Duration of episode(s) is 2 weeks. Exacerbated by: Laying down. Pertinent negatives include no abdominal pain, no cough, no fever, no hemoptysis, no lower extremity edema, no shortness of breath, no vomiting and no weakness. Treatments tried: Moving around, walking. The treatment provided mild relief. There are no known risk factors.  Her past medical history is significant for anxiety/panic attacks.  Pertinent negatives for past medical history include no diabetes, no hyperlipidemia and no hypertension.  Pertinent negatives for family medical history include: no early MI, no PE and no sickle cell disease.  Procedure history is negative for echocardiogram and stress echo.    Past Medical History:  Diagnosis Date  . Anxiety     There are no active problems to display for this patient.   Past Surgical History:  Procedure Laterality Date  . CESAREAN SECTION      OB History    No data available       Home Medications    Prior to Admission medications   Not on File    Family History History reviewed. No pertinent family history.  Social History Social History  Substance Use Topics  . Smoking status: Never Smoker  . Smokeless tobacco: Never Used  . Alcohol use No     Allergies     Ibuprofen   Review of Systems Review of Systems  Constitutional: Negative for fever.  HENT: Negative.   Respiratory: Negative for cough, hemoptysis and shortness of breath.   Cardiovascular: Positive for chest pain.  Gastrointestinal: Negative for abdominal pain and vomiting.  Genitourinary: Negative.   Musculoskeletal: Negative.   Skin: Negative.   Allergic/Immunologic: Negative for immunocompromised state.  Neurological: Negative for weakness.  Hematological: Does not bruise/bleed easily.  Psychiatric/Behavioral: Negative.      Physical Exam Updated Vital Signs BP 118/82   Pulse 108   Temp 98.4 F (36.9 C) (Oral)   Resp 16   Ht 5\' 3"  (1.6 m)   Wt 52.4 kg   LMP 04/27/2016   SpO2 98%   BMI 20.48 kg/m   Physical Exam  Constitutional: She is oriented to person, place, and time. She appears well-developed and well-nourished. No distress.  HENT:  Head: Normocephalic and atraumatic.  Eyes: Conjunctivae and EOM are normal.  Neck: Normal range of motion. Neck supple.  Cardiovascular: Regular rhythm, normal heart sounds and intact distal pulses.  Tachycardia present.  Exam reveals no gallop and no friction rub.   No murmur heard. Pulses:      Radial pulses are 2+ on the right side, and 2+ on the left side.  Pulmonary/Chest: Effort normal and breath sounds normal. No respiratory distress. She has no wheezes. She has no rales. She exhibits tenderness.  Abdominal: Soft. She exhibits no distension  and no mass. There is no tenderness. There is no guarding.  Musculoskeletal: She exhibits no edema.  Neurological: She is alert and oriented to person, place, and time.  Skin: Skin is warm and dry. Capillary refill takes less than 2 seconds. No rash noted. She is not diaphoretic. No erythema. No pallor.  Psychiatric: She has a normal mood and affect. Her behavior is normal. Judgment and thought content normal.     ED Treatments / Results  Labs (all labs ordered are listed, but  only abnormal results are displayed) Labs Reviewed  BASIC METABOLIC PANEL - Abnormal; Notable for the following:       Result Value   Potassium 3.3 (*)    Glucose, Bld 137 (*)    All other components within normal limits  CBC  D-DIMER, QUANTITATIVE (NOT AT [facility])  I-STAT TROPOININ, ED  I-STAT BETA HCG BLOOD, ED ([facility], [facility], [facility] ONLY)  I-STAT TROPOININ, ED    EKG  EKG Interpretation  Date/Time:  Friday May 02 2016 19:07:35 EDT Ventricular Rate:  118 PR Interval:  150 QRS Duration: 72 QT Interval:  320 QTC Calculation: 448 R Axis:   75 Text Interpretation:  Sinus tachycardia Possible Left atrial enlargement Nonspecific ST and T wave abnormality Abnormal ECG Confirmed by Jodi Mourning MD, Ivin Booty ([id number]) on 05/02/2016 9:49:56 PM Also confirmed by Jodi Mourning MD, JOSHUA [phone]), editor [place], Cala Bradford [phone])  on 05/03/2016 9:13:47 AM       Radiology Dg Chest 2 View  Result Date: 05/02/2016 CLINICAL DATA:  Left upper chest pain, 1 month duration. EXAM: CHEST  2 VIEW COMPARISON:  04/30/2016.  02/04/2016. FINDINGS: Heart size is normal. Mediastinal shadows are normal. The lungs are clear. No bronchial thickening. No infiltrate, mass, effusion or collapse. Pulmonary vascularity is normal. No bony abnormality. IMPRESSION: Normal chest Electronically Signed   By: Paulina Fusi M.D.   On: 05/02/2016 19:39    Procedures Procedures (including critical care time)    EMERGENCY DEPARTMENT [place] CARDIAC EXAM "Study: Limited Ultrasound of the heart and pericardium"  INDICATIONS:Tachycardia Multiple views of the heart and pericardium are obtained with a multi-frequency probe.  PERFORMED BJ:YNWGNF  IMAGES ARCHIVED?: Yes  FINDINGS: No pericardial effusion and Normal contractility  VIEWS USED: Subcostal 4 chamber and Parasternal long axis  INTERPRETATION: Cardiac activity present, Pericardial effusioin absent and Normal contractility  COMMENT:  No pericardial effusion   Medications Ordered in  ED Medications  acetaminophen (TYLENOL) tablet 1,000 mg (1,000 mg Oral Given 05/02/16 2153)     Initial Impression / Assessment and Plan / ED Course  I have reviewed the triage vital signs and the nursing notes.  Pertinent labs & imaging results that were available during my care of the patient were reviewed by me and considered in my medical decision making (see chart for details).  Clinical Course    Patient presents with chest tightness that has been intermittent for the last month but worse today for the last six hours. She is otherwise healthy and at very low risk for ACS based on comorbidities, age, gender, lack of family history. EKG reveals sinus tachycardia with no signs of acute ischemic changes. She is overall well-appearing and tachycardic to the low 100s but with otherwise normal vital signs. Labs are unremarkable, no signs of infectious etiology at present given lack of other symptoms. Chest x-ray unremarkable without signs of pneumonia, pneumothorax, cardiomegaly. Bedside echo without pericardial effusion. She is low-risk by [facility] and d-dimer was negative, low concern for VTE. Spot troponin after  6 hours of symptoms was negative, do not suspect myocarditis given this results. Do not feel another troponin is necessary given her extremely low risk for ACS and symptoms present for 6 hours already. She does note a viral illness two weeks ago and her pain is improved on standing, so pericarditis is on the differential. Instructed her to treat with OTC analgesics (tylenol as she is allergic to ibuprofen) and follow up with primary care for continued symptoms. She was given return precautions and expressed understanding. She is in good condition for discharge home.  Final Clinical Impressions(s) / ED Diagnoses   Final diagnoses:  Chest wall pain  Atypical chest pain    New Prescriptions Discharge Medication List as of 05/02/2016 10:59 PM       Preston FleetingAnna Elantra Caprara, MD 05/03/16 1145     Blane OharaJoshua Zavitz, MD 05/03/16 1524

## 2016-05-02 NOTE — ED Triage Notes (Signed)
Pt states intermittent L sided chest pain that radiates to L arm x 1 month. Pt states some tightness. Pt denies any injury/trauma. Pt denies any n/v.

## 2018-09-23 ENCOUNTER — Emergency Department: Payer: Self-pay

## 2018-09-23 ENCOUNTER — Other Ambulatory Visit: Payer: Self-pay

## 2018-09-23 ENCOUNTER — Emergency Department
Admission: EM | Admit: 2018-09-23 | Discharge: 2018-09-23 | Disposition: A | Discharge status: Home / Self Care | Payer: Self-pay | Attending: Emergency Medicine | Admitting: Emergency Medicine

## 2018-09-23 ENCOUNTER — Encounter: Payer: Self-pay | Admitting: Emergency Medicine

## 2018-09-23 ENCOUNTER — Other Ambulatory Visit: Payer: Self-pay | Admitting: Emergency Medicine

## 2018-09-23 DIAGNOSIS — R1310 Dysphagia, unspecified: Secondary | ICD-10-CM | POA: Insufficient documentation

## 2018-09-23 DIAGNOSIS — J384 Edema of larynx: Secondary | ICD-10-CM | POA: Insufficient documentation

## 2018-09-23 LAB — GROUP A STREP BY PCR: Group A Strep by PCR: NOT DETECTED

## 2018-09-23 IMAGING — CR NECK SOFT TISSUES - 1+ VIEW
2 series · 2 of 2 positions shown · non-contrast
Comparison: None.

CLINICAL DATA: Sore throat for 2 weeks

EXAM:
NECK SOFT TISSUES - 1+ VIEW

[neck lat]
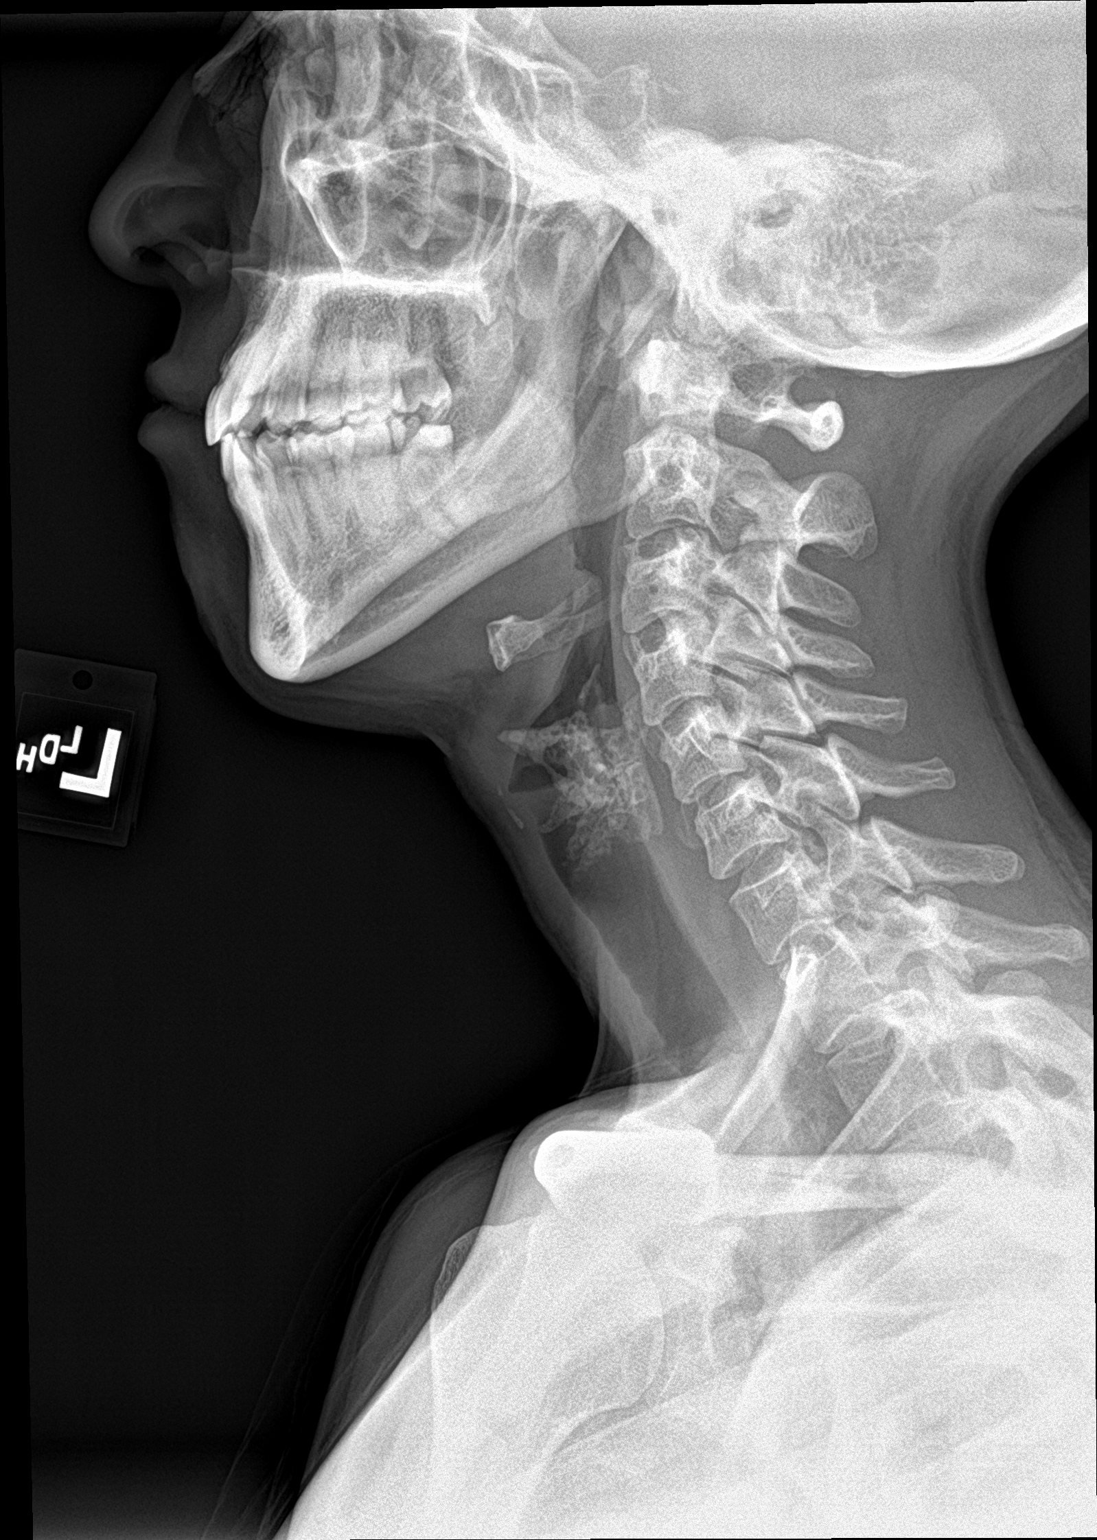

[neck ap]
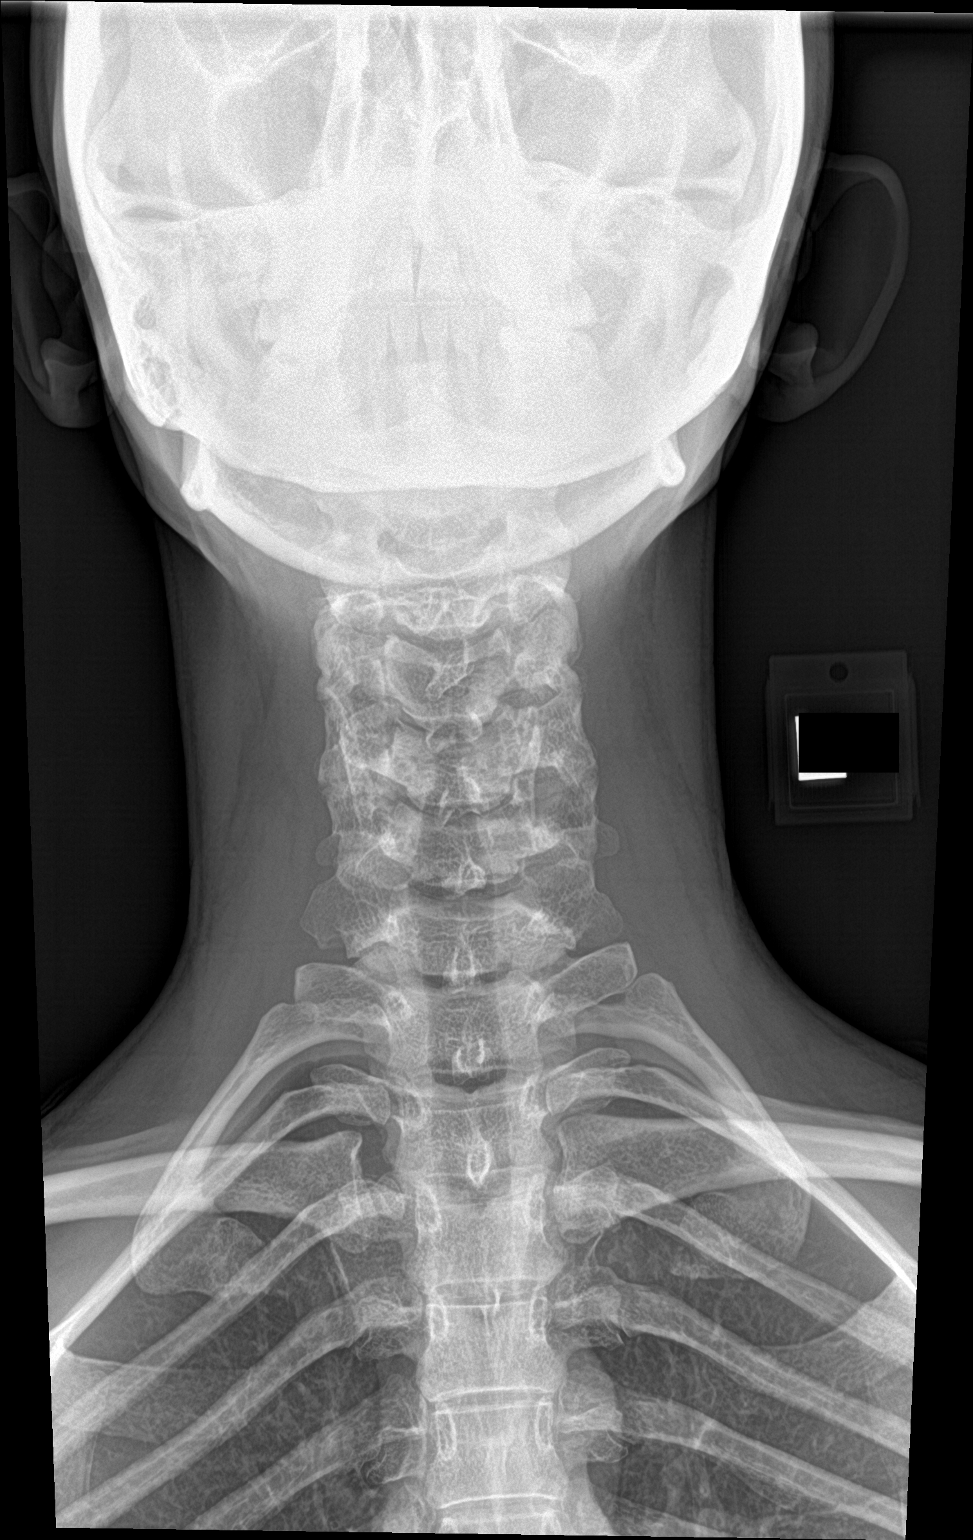

[2 of 2 positions shown; findings below may reference images not displayed]

FINDINGS: The epiglottis and aryepiglottic folds are within normal limits. No
prevertebral soft tissue changes are noted. No bony abnormality is
seen. No foreign body is noted.
IMPRESSION: No acute abnormality seen.

## 2018-09-23 MED ORDER — LIDOCAINE VISCOUS HCL 2 % MT SOLN
15.0000 mL | Freq: Once | OROMUCOSAL | Status: AC
  Administered 2018-09-23: 15 mL via OROMUCOSAL
  Filled 2018-09-23: qty 15

## 2018-09-23 MED ORDER — DIPHENHYDRAMINE HCL 12.5 MG/5ML PO ELIX
12.5000 mg | ORAL_SOLUTION | Freq: Once | ORAL | Status: AC
  Administered 2018-09-23: 12.5 mg via ORAL
  Filled 2018-09-23: qty 5

## 2018-09-23 MED ORDER — MAGIC MOUTHWASH W/LIDOCAINE: 5.0000 mL | Freq: Four times a day (QID) | ORAL | 0 refills | Status: DC

## 2018-09-23 NOTE — ED Triage Notes (Signed)
Presents with sore throat for the past 2 weeks   Pt was triage on paper d/t downtime

## 2018-09-23 NOTE — Discharge Instructions (Addendum)
Continue previous medication and start Magic mouthwash as directed.  Follow-up with the ENT clinic if no improvement in 5 days.

## 2018-09-23 NOTE — ED Notes (Signed)
States she developed sore throat about 2 weeks ago  States was tested for strept by PCP  And it was negative    States her throat feels swollen  Afebrile on arrival

## 2018-09-23 NOTE — ED Provider Notes (Signed)
[facility] Emergency Department Provider Note   ____________________________________________   None    (approximate)  I have reviewed the triage vital signs and the nursing notes.   HISTORY  Chief Complaint Sore Throat    HPI Terry Wilkerson is a 33 y.o. female patient complain of sore throat for 2 weeks.  Patient states she feels "swelling in throat".  Patient says difficulty swallowing and feels like shortness of breath.  Patient able to tolerate fluids and soft foods.  Patient denies fever or cough.  No recent travels.  Patient recently seen by PCP and was diagnosed with reflux esophagitis.  Patient has not started treatment plan outlined by PCP.  Patient rates her pain as a 7/10.  Patient described the pain is "sore".         Past Medical History:  Diagnosis Date  . Anxiety     There are no active problems to display for this patient.   Past Surgical History:  Procedure Laterality Date  . CESAREAN SECTION      Prior to Admission medications   Medication Sig Start Date End Date Taking? Authorizing Provider  magic mouthwash w/lidocaine SOLN Take 5 mLs by mouth 4 (four) times daily. Swish and slow swallow 09/23/18   Joni Reining, PA-C    Allergies Ibuprofen  No family history on file.  Social History Social History   Tobacco Use  . Smoking status: Never Smoker  . Smokeless tobacco: Never Used  Substance Use Topics  . Alcohol use: No  . Drug use: Not on file    Review of Systems  Constitutional: No fever/chills Eyes: No visual changes. ENT: Sore throat. Cardiovascular: Denies chest pain. Respiratory:  shortness of breath. Gastrointestinal: No abdominal pain.  No nausea, no vomiting.  No diarrhea.  No constipation. Genitourinary: Negative for dysuria. Musculoskeletal: Negative for back pain. Skin: Negative for rash. Neurological: Negative for headaches, focal weakness or numbness.    ____________________________________________   PHYSICAL EXAM:  VITAL SIGNS: ED Triage Vitals  Enc Vitals Group     BP      Pulse      Resp      Temp      Temp src      SpO2      Weight      Height      Head Circumference      Peak Flow      Pain Score      Pain Loc      Pain Edu?      Excl. in GC?     Constitutional: Alert and oriented. Well appearing and in no acute distress. Eyes: Conjunctivae are normal. PERRL. EOMI. Head: Atraumatic. Nose: No congestion/rhinnorhea. Mouth/Throat: Mucous membranes are moist.  Oropharynx non-erythematous. Neck: No stridor.   Hematological/Lymphatic/Immunilogical: Left cervical lymphadenopathy. Cardiovascular: Normal rate, regular rhythm. Grossly normal heart sounds.  Good peripheral circulation. Respiratory: Normal respiratory effort.  No retractions. Lungs CTAB. Gastrointestinal: Soft and nontender. No distention. No abdominal bruits. No CVA tenderness. Skin:  Skin is warm, dry and intact. No rash noted. Psychiatric: Mood and affect are normal. Speech and behavior are normal.  ____________________________________________   LABS (all labs ordered are listed, but only abnormal results are displayed)  Labs Reviewed  GROUP A STREP BY PCR   ____________________________________________  EKG   ____________________________________________  RADIOLOGY  ED MD interpretation:    Official radiology report(s): Dg Neck Soft Tissue  Result Date: 09/23/2018 CLINICAL DATA:  Sore throat for 2  weeks EXAM: NECK SOFT TISSUES - 1+ VIEW COMPARISON:  None. FINDINGS: The epiglottis and aryepiglottic folds are within normal limits. No prevertebral soft tissue changes are noted. No bony abnormality is seen. No foreign body is noted. IMPRESSION: No acute abnormality seen. Electronically Signed   By: Alcide Clever M.D.   On: 09/23/2018 10:51    ____________________________________________   PROCEDURES  Procedure(s) performed (including Critical  Care):  Procedures   ____________________________________________   INITIAL IMPRESSION / ASSESSMENT AND PLAN / ED COURSE  As part of my medical decision making, I reviewed the following data within the electronic MEDICAL RECORD NUMBER         Sore throat and dysphagia secondary to reflux.  Patient advised to start medication prescribed by PCP.  Patient given a prescription for Magic mouthwash.  Patient advised to follow discharge care instruction follow ENT clinic if no improvement in 1 week.  Return to ED if condition worsens.      ____________________________________________   FINAL CLINICAL IMPRESSION(S) / ED DIAGNOSES  Final diagnoses:  Edema glottis  Dysphagia  Dysphagia, unspecified type     ED Discharge Orders         Ordered    magic mouthwash w/lidocaine SOLN  4 times daily     09/23/18 1104           Note:  This document was prepared using Dragon voice recognition software and may include unintentional dictation errors.    Joni Reining, PA-C 09/23/18 1111    Sharman Cheek, MD 09/23/18 [phone]

## 2018-09-24 ENCOUNTER — Encounter: Payer: Self-pay | Admitting: Emergency Medicine

## 2018-09-24 ENCOUNTER — Other Ambulatory Visit: Payer: Self-pay

## 2018-09-24 ENCOUNTER — Emergency Department
Admission: EM | Admit: 2018-09-24 | Discharge: 2018-09-24 | Disposition: A | Discharge status: Home / Self Care | Payer: Self-pay | Attending: Emergency Medicine | Admitting: Emergency Medicine

## 2018-09-24 DIAGNOSIS — F419 Anxiety disorder, unspecified: Secondary | ICD-10-CM | POA: Insufficient documentation

## 2018-09-24 DIAGNOSIS — R1310 Dysphagia, unspecified: Secondary | ICD-10-CM | POA: Insufficient documentation

## 2018-09-24 MED ORDER — HYDROXYZINE HCL 25 MG PO TABS
25.0000 mg | ORAL_TABLET | Freq: Once | ORAL | Status: AC
  Administered 2018-09-24: 25 mg via ORAL
  Filled 2018-09-24: qty 1

## 2018-09-24 MED ORDER — HYDROXYZINE HCL 25 MG PO TABS: 25.0000 mg | ORAL_TABLET | Freq: Three times a day (TID) | ORAL | 0 refills | Status: DC | PRN

## 2018-09-24 NOTE — ED Provider Notes (Signed)
[facility] Emergency Department Provider Note  ____________________________________________  Time seen: Approximately 7:51 PM  I have reviewed the triage vital signs and the nursing notes.   HISTORY  Chief Complaint Oral Swelling and Anxiety    HPI Terry Wilkerson is a 33 y.o. female with a past history of anxiety who complains of feeling like her throat is swelling and closing up, associated with tingling in both her hands and face and feeling like she is going to pass out and shortness of breath.  She reports she was in her usual state of health until earlier today when she was waiting at home for her son to get home from the bus.  She suddenly started to feel like her throat was closing, and then she got short of breath and felt kind of panicked.  She denies a history of panic attacks, denies taking anything for anxiety.  She reports that she is been having episodes like this off and on over the past several days and had just called the ENT clinic to see if she can get an appointment with them.  Denies any new exposures or insect bites or stings.  Not taking any medications.  No known allergies.  She tried taking 10 mg of Benadryl at home with mild relief.  She was seen in the ED for the same symptoms yesterday, had a negative strep test, unremarkable soft tissue neck x-ray, discharged home with Magic mouthwash solution.    Past Medical History:  Diagnosis Date  . Anxiety      There are no active problems to display for this patient.    Past Surgical History:  Procedure Laterality Date  . CESAREAN SECTION       Prior to Admission medications   Medication Sig Start Date End Date Taking? Authorizing Provider  hydrOXYzine (ATARAX/VISTARIL) 25 MG tablet Take 1 tablet (25 mg total) by mouth 3 (three) times daily as needed. 09/24/18   Sharman Cheek, MD  magic mouthwash w/lidocaine SOLN Take 5 mLs by mouth 4 (four) times daily. Swish and slow swallow  09/23/18   Joni Reining, PA-C     Allergies Ibuprofen   History reviewed. No pertinent family history.  Social History Social History   Tobacco Use  . Smoking status: Never Smoker  . Smokeless tobacco: Never Used  Substance Use Topics  . Alcohol use: No  . Drug use: Not on file    Review of Systems  Constitutional:   No fever or chills.  ENT:   Positive dysphagia. No rhinorrhea. Cardiovascular:   No chest pain or syncope. Respiratory:   No dyspnea or cough. Gastrointestinal:   Negative for abdominal pain, vomiting and diarrhea.  Musculoskeletal:   Negative for focal pain or swelling All other systems reviewed and are negative except as documented above in ROS and HPI.  ____________________________________________   PHYSICAL EXAM:  VITAL SIGNS: ED Triage Vitals  Enc Vitals Group     BP 09/24/18 1510 (!) 136/94     Pulse Rate 09/24/18 1510 (!) 119     Resp 09/24/18 1510 (!) 23     Temp 09/24/18 1510 98 F (36.7 C)     Temp Source 09/24/18 1510 Oral     SpO2 09/24/18 1510 100 %     Weight 09/24/18 1510 117 lb (53.1 kg)     Height 09/24/18 1510 5\' 3"  (1.6 m)     Head Circumference --      Peak Flow --  Pain Score 09/24/18 1517 0     Pain Loc --      Pain Edu? --      Excl. in GC? --     Vital signs reviewed, nursing assessments reviewed.   Constitutional:   Alert and oriented. Non-toxic appearance. Eyes:   Conjunctivae are normal. EOMI. PERRL. ENT      Head:   Normocephalic and atraumatic.      Nose:   No congestion/rhinnorhea.       Mouth/Throat:   MMM, no pharyngeal erythema. No peritonsillar mass.  No exudates, no tonsillar swelling.      Neck:   No meningismus. Full ROM. Hematological/Lymphatic/Immunilogical:   No cervical lymphadenopathy. Cardiovascular:   RRR. Symmetric bilateral radial and DP pulses.  No murmurs. Cap refill less than 2 seconds. Respiratory:   Normal respiratory effort without tachypnea/retractions. Breath sounds are clear  and equal bilaterally. No wheezes/rales/rhonchi. Gastrointestinal:   Soft and nontender. Non distended. There is no CVA tenderness.  No rebound, rigidity, or guarding. Musculoskeletal:   Normal range of motion in all extremities.  Neurologic:   Normal speech and language.  Motor grossly intact. No acute focal neurologic deficits are appreciated.  Skin:    Skin is warm, dry and intact. No rash noted.  No petechiae, purpura, or bullae.  No hives  ____________________________________________    LABS (pertinent positives/negatives) (all labs ordered are listed, but only abnormal results are displayed) Labs Reviewed - No data to display ____________________________________________   EKG    ____________________________________________    RADIOLOGY  No results found.  ____________________________________________   PROCEDURES Procedures  ____________________________________________    CLINICAL IMPRESSION / ASSESSMENT AND PLAN / ED COURSE  Medications ordered in the ED: Medications  hydrOXYzine (ATARAX/VISTARIL) tablet 25 mg (25 mg Oral Given 09/24/18 1621)    Pertinent labs & imaging results that were available during my care of the patient were reviewed by me and considered in my medical decision making (see chart for details).    Patient presents with a feeling of her throat closing.  Symptoms are highly consistent with anxiety.  She is not in respiratory distress, managing secretions, normal work of breathing, clear lungs.  Vital signs unremarkable.  Agree with follow-up with ENT for her dysphasia symptoms, but also recommended that she follow-up with a behavioral medicine provider for evaluation of possible anxiety for effective pharmacologic management if needed.  In the meantime I will have her try Vistaril to see if this helps with her symptoms.  No evidence of airway compromise or soft tissue swelling or infection.  Doubt vascular pathology or cardiopulmonary  disease.      ____________________________________________   FINAL CLINICAL IMPRESSION(S) / ED DIAGNOSES    Final diagnoses:  Dysphagia, unspecified type  Anxiety     ED Discharge Orders         Ordered    hydrOXYzine (ATARAX/VISTARIL) 25 MG tablet  3 times daily PRN     09/24/18 1951          Portions of this note were generated with dragon dictation software. Dictation errors may occur despite best attempts at proofreading.   Sharman Cheek, MD 09/24/18 Corky Crafts

## 2018-09-24 NOTE — ED Notes (Signed)
Pt requesting to have iv removed if possible. Pt informed at this time we need to leave iv in place. Pt declines to leave monitoring equipment in place. Pt walking around ed freely. Pt informed at this time to please remain in her room. Pt verbalizes understanding.

## 2018-09-24 NOTE — ED Triage Notes (Addendum)
Pt presenting to lobby tearful and reporting her throat is closing. Pt denies having been exposed to anything new and denies knowledge of any allergies. Pt was seen in ED yesterday for same complaint and was dx with GERD.  PT reports she took 10 mg of benadryl before coming to ED.

## 2018-11-30 ENCOUNTER — Encounter: Payer: Self-pay | Admitting: *Deleted

## 2018-12-09 ENCOUNTER — Other Ambulatory Visit: Payer: Self-pay | Admitting: Gastroenterology

## 2018-12-09 DIAGNOSIS — R1312 Dysphagia, oropharyngeal phase: Secondary | ICD-10-CM

## 2018-12-21 ENCOUNTER — Encounter
Admission: RE | Admit: 2018-12-21 | Discharge: 2018-12-21 | Disposition: A | Discharge status: Home / Self Care | Payer: Self-pay | Source: Ambulatory Visit | Attending: Gastroenterology | Admitting: Gastroenterology

## 2018-12-21 ENCOUNTER — Other Ambulatory Visit: Payer: Self-pay

## 2018-12-21 DIAGNOSIS — R1312 Dysphagia, oropharyngeal phase: Secondary | ICD-10-CM | POA: Insufficient documentation

## 2020-06-29 ENCOUNTER — Encounter: Payer: Self-pay | Admitting: Emergency Medicine

## 2020-06-29 ENCOUNTER — Emergency Department
Admission: EM | Admit: 2020-06-29 | Discharge: 2020-06-29 | Disposition: A | Discharge status: Home / Self Care | Payer: Self-pay | Attending: Emergency Medicine | Admitting: Emergency Medicine

## 2020-06-29 ENCOUNTER — Other Ambulatory Visit: Payer: Self-pay

## 2020-06-29 DIAGNOSIS — S61214A Laceration without foreign body of right ring finger without damage to nail, initial encounter: Secondary | ICD-10-CM | POA: Insufficient documentation

## 2020-06-29 DIAGNOSIS — Z23 Encounter for immunization: Secondary | ICD-10-CM | POA: Insufficient documentation

## 2020-06-29 DIAGNOSIS — W268XXA Contact with other sharp object(s), not elsewhere classified, initial encounter: Secondary | ICD-10-CM | POA: Insufficient documentation

## 2020-06-29 MED ORDER — TETANUS-DIPHTH-ACELL PERTUSSIS 5-2.5-18.5 LF-MCG/0.5 IM SUSY
0.5000 mL | PREFILLED_SYRINGE | Freq: Once | INTRAMUSCULAR | Status: AC
  Administered 2020-06-29: 0.5 mL via INTRAMUSCULAR
  Filled 2020-06-29: qty 0.5

## 2020-06-29 NOTE — ED Triage Notes (Signed)
Pt to ED via POV with c/o laceration to R ring finger that happened earlier today, pt states cut her finger on a can, clean bandage applied by this RN, bleeding controlled with bandages and pressure.

## 2020-06-29 NOTE — ED Provider Notes (Signed)
Emergency Department Provider Note  ____________________________________________  Time seen: Approximately 6:12 PM  I have reviewed the triage vital signs and the nursing notes.   HISTORY  Chief Complaint Laceration   Historian Patient    HPI Terry Wilkerson is a 34 y.o. female presents to the emergency department with a 0.5 cm laceration along the distal aspect of the right ring finger sustained accidentally on a metal can.  Patient denies numbness or tingling.  She cannot remember her last tetanus shot.  No other alleviating measures have been attempted.   Past Medical History:  Diagnosis Date  . Anxiety      Immunizations up to date:  Yes.     Past Medical History:  Diagnosis Date  . Anxiety     There are no problems to display for this patient.   Past Surgical History:  Procedure Laterality Date  . CESAREAN SECTION      Prior to Admission medications   Medication Sig Start Date End Date Taking? Authorizing Provider  hydrOXYzine (ATARAX/VISTARIL) 25 MG tablet Take 1 tablet (25 mg total) by mouth 3 (three) times daily as needed. 09/24/18   Sharman Cheek, MD  magic mouthwash w/lidocaine SOLN Take 5 mLs by mouth 4 (four) times daily. Swish and slow swallow 09/23/18   Joni Reining, PA-C    Allergies Ibuprofen  History reviewed. No pertinent family history.  Social History Social History   Tobacco Use  . Smoking status: Never Smoker  . Smokeless tobacco: Never Used  Substance Use Topics  . Alcohol use: No     Review of Systems  Constitutional: No fever/chills Eyes:  No discharge ENT: No upper respiratory complaints. Respiratory: no cough. No SOB/ use of accessory muscles to breath Gastrointestinal:   No nausea, no vomiting.  No diarrhea.  No constipation. Musculoskeletal: Negative for musculoskeletal pain. Skin: Patient has laceration to right ring finger.     ____________________________________________   PHYSICAL EXAM:  VITAL  SIGNS: ED Triage Vitals  Enc Vitals Group     BP 06/29/20 1505 (!) 137/95     Pulse Rate 06/29/20 1505 99     Resp 06/29/20 1505 18     Temp 06/29/20 1505 98.3 F (36.8 C)     Temp Source 06/29/20 1505 Oral     SpO2 06/29/20 1505 99 %     Weight 06/29/20 1506 115 lb (52.2 kg)     Height 06/29/20 1506 5\' 3"  (1.6 m)     Head Circumference --      Peak Flow --      Pain Score 06/29/20 1505 3     Pain Loc --      Pain Edu? --      Excl. in GC? --      Constitutional: Alert and oriented. Well appearing and in no acute distress. Eyes: Conjunctivae are normal. PERRL. EOMI. Head: Atraumatic. Cardiovascular: Normal rate, regular rhythm. Normal S1 and S2.  Good peripheral circulation. Respiratory: Normal respiratory effort without tachypnea or retractions. Lungs CTAB. Good air entry to the bases with no decreased or absent breath sounds Gastrointestinal: Bowel sounds x 4 quadrants. Soft and nontender to palpation. No guarding or rigidity. No distention. Musculoskeletal: No flexor or extensor tendon deficits appreciated with right ring finger testing. Neurologic:  Normal for age. No gross focal neurologic deficits are appreciated.  Skin: Patient has a 0.25 cm well approximated skin laceration at distal aspect of right ring finger. Psychiatric: Mood and affect are normal for age. Speech and  behavior are normal.   ____________________________________________   LABS (all labs ordered are listed, but only abnormal results are displayed)  Labs Reviewed - No data to display ____________________________________________  EKG   ____________________________________________  RADIOLOGY   No results found.  ____________________________________________    PROCEDURES  Procedure(s) performed:     Marland KitchenMarland KitchenLaceration Repair  Date/Time: 06/29/2020 6:17 PM Performed by: Orvil Feil, PA-C Authorized by: Orvil Feil, PA-C   Consent:    Consent obtained:  Verbal   Consent given  by:  Patient Anesthesia:    Anesthesia method:  None Laceration details:    Location:  Finger   Finger location:  R ring finger   Length (cm):  0.3   Depth (mm):  3 Exploration:    Contaminated: no   Treatment:    Amount of cleaning:  Standard   Irrigation solution:  Sterile saline   Visualized foreign bodies/material removed: no   Skin repair:    Repair method:  Tissue adhesive Approximation:    Approximation:  Close Repair type:    Repair type:  Simple Post-procedure details:    Dressing:  Open (no dressing)   Procedure completion:  Tolerated well, no immediate complications       Medications  Tdap (BOOSTRIX) injection 0.5 mL (0.5 mLs Intramuscular Given 06/29/20 1747)     ____________________________________________   INITIAL IMPRESSION / ASSESSMENT AND PLAN / ED COURSE  Pertinent labs & imaging results that were available during my care of the patient were reviewed by me and considered in my medical decision making (see chart for details).      Assessment and Plan: Laceration 34 year old female presents to the emergency department with a laceration of the right ring finger repaired with Dermabond in the emergency department without complication. Patient's tetanus status was updated prior to discharge. Tylenol and ibuprofen alternating were recommended for discomfort. All patient questions were answered.    ____________________________________________  FINAL CLINICAL IMPRESSION(S) / ED DIAGNOSES  Final diagnoses:  Laceration of right ring finger without foreign body without damage to nail, initial encounter      NEW MEDICATIONS STARTED DURING THIS VISIT:  ED Discharge Orders    None          This chart was dictated using voice recognition software/Dragon. Despite best efforts to proofread, errors can occur which can change the meaning. Any change was purely unintentional.     Orvil Feil, PA-C 06/29/20 1854    Jene Every,  MD 06/29/20 Windell Moment

## 2021-11-12 ENCOUNTER — Encounter: Payer: Self-pay | Admitting: Obstetrics and Gynecology

## 2023-03-18 ENCOUNTER — Other Ambulatory Visit: Payer: Self-pay

## 2023-03-18 ENCOUNTER — Emergency Department
Admission: EM | Admit: 2023-03-18 | Discharge: 2023-03-18 | Disposition: A | Discharge status: Home / Self Care | Payer: Self-pay | Attending: Emergency Medicine | Admitting: Emergency Medicine

## 2023-03-18 DIAGNOSIS — H6123 Impacted cerumen, bilateral: Secondary | ICD-10-CM | POA: Insufficient documentation

## 2023-03-18 MED ORDER — ACETAMINOPHEN 325 MG PO TABS: 650.0000 mg | ORAL_TABLET | Freq: Once | ORAL | Status: DC

## 2023-03-18 NOTE — ED Triage Notes (Signed)
Pt reports thinks has fluid in ears, feels clogged x 2weeks.

## 2023-03-18 NOTE — Discharge Instructions (Addendum)
Please go to your preferred pharmacy and get ear drops called Debrox.   Schedule an appointment with Dr. Willeen Cass if you don't get any better using the ear drops.   You can take tylenol and ibuprofen as needed for pain.

## 2023-03-18 NOTE — ED Provider Notes (Signed)
[facility] Provider Note    Event Date/Time   First MD Initiated Contact with Patient 03/18/23 1019     (approximate)   History   Ear Fullness   HPI  Terry Wilkerson is a 37 y.o. female with no PMH who presents for evaluation of bilateral ear fullness.  Patient states it has been going on for about 2 weeks.  She denies tinnitus.  She has not been swimming recently or phone in an airplane.      Physical Exam   Triage Vital Signs: ED Triage Vitals  Encounter Vitals Group     BP 03/18/23 0958 (!) 142/94     Systolic BP Percentile --      Diastolic BP Percentile --      Pulse Rate 03/18/23 0958 (!) 120     Resp 03/18/23 0958 18     Temp 03/18/23 0958 98.4 F (36.9 C)     Temp Source 03/18/23 0958 Oral     SpO2 03/18/23 0958 100 %     Weight 03/18/23 0958 110 lb (49.9 kg)     Height 03/18/23 0958 5\' 3"  (1.6 m)     Head Circumference --      Peak Flow --      Pain Score 03/18/23 1000 0     Pain Loc --      Pain Education --      Exclude from Growth Chart --     Most recent vital signs: Vitals:   03/18/23 0958  BP: (!) 142/94  Pulse: (!) 120  Resp: 18  Temp: 98.4 F (36.9 C)  SpO2: 100%   General: Awake, no distress. Requested door stay open due to claustrophobia. CV:  Good peripheral perfusion. RRR, tachy. Resp:  Normal effort. CTAB. Abd:  No distention.  Other:  Bilateral cerumen impaction.   ED Results / Procedures / Treatments   Labs (all labs ordered are listed, but only abnormal results are displayed) Labs Reviewed - No data to display  PROCEDURES:  Critical Care performed: No  Procedures   MEDICATIONS ORDERED IN ED: Medications  acetaminophen (TYLENOL) tablet 650 mg (has no administration in time range)     IMPRESSION / MDM / ASSESSMENT AND PLAN / ED COURSE  I reviewed the triage vital signs and the nursing notes.                             37 year old female presents for evaluation of bilateral ear  fullness.  Patient was hypertensive and tachycardic in triage, very anxious appearing on exam.  Differential diagnosis includes, but is not limited to, cerumen impaction, otitis media, otitis externa, tympanic membrane rupture.  Patient's presentation is most consistent with acute, uncomplicated illness.  Patient has a bilateral cerumen impaction. I am unable to visualize the tympanic membrane. We will irrigate the ears in the ED and then reassess.   Patient was reassessed and I spoke with the nursing staff. They were not able to get any of the cerumen out. Patient now feels that the fluid is stuck in her ear and it is starting to hurt worse. She was unable tolerate anything further. She was given tylenol for pain.  Patient was instructed to pick up Debrox and follow-up with ENT if she does not get symptom relief from that.  Patient voiced understanding, all questions were answered and she was stable at discharge.     FINAL CLINICAL  IMPRESSION(S) / ED DIAGNOSES   Final diagnoses:  Bilateral impacted cerumen     Rx / DC Orders   ED Discharge Orders     None        Note:  This document was prepared using Dragon voice recognition software and may include unintentional dictation errors.   Cameron Ali, PA-C 03/18/23 1223    Shaune Pollack, MD 03/18/23 [phone]

## 2023-03-18 NOTE — ED Notes (Signed)
Went into room to discharge patient, room empty.  Patient personal belongings not in room.  Patient left without discharge instructions.

## 2023-03-18 NOTE — ED Notes (Signed)
This RN and sydni, rn to bedside to irrigate ear using syrince, 18g angiocath, sterile saline and hydrogen peroxide. No results. Lauren, PA notified.

## 2023-05-18 ENCOUNTER — Emergency Department
Admission: EM | Admit: 2023-05-18 | Discharge: 2023-05-18 | Disposition: A | Discharge status: Home / Self Care | Payer: Self-pay | Source: Home / Self Care

## 2023-09-01 ENCOUNTER — Other Ambulatory Visit: Payer: Self-pay

## 2023-09-01 ENCOUNTER — Emergency Department
Admission: EM | Admit: 2023-09-01 | Discharge: 2023-09-01 | Disposition: A | Discharge status: Home / Self Care | Payer: Self-pay | Attending: Emergency Medicine | Admitting: Emergency Medicine

## 2023-09-01 DIAGNOSIS — H9203 Otalgia, bilateral: Secondary | ICD-10-CM | POA: Diagnosis present

## 2023-09-01 DIAGNOSIS — H6983 Other specified disorders of Eustachian tube, bilateral: Secondary | ICD-10-CM | POA: Insufficient documentation

## 2023-09-01 DIAGNOSIS — H6993 Unspecified Eustachian tube disorder, bilateral: Secondary | ICD-10-CM

## 2023-09-01 NOTE — ED Provider Notes (Signed)
 [facility] Provider Note    Event Date/Time   First MD Initiated Contact with Patient 09/01/23 2042     (approximate)   History   Otalgia   HPI  Terry Wilkerson is a 38 y.o. female presenting with bilateral ear pain for the last month.  States that she has been seen by an ENT surgeon who believes that her eustachian tubes are narrowed, gave her prescription for nasal spray, told her to take it daily but is unable to do surgery on it to open the tube because patient did not have insurance.  She is in the process of getting Medicaid.  She denies any drainage through a year, no outer ear pain.  No congestion, headache, cough.  Is asking for some additional medications to help with her symptoms.     Physical Exam   Triage Vital Signs: ED Triage Vitals  Encounter Vitals Group     BP 09/01/23 2019 121/85     Systolic BP Percentile --      Diastolic BP Percentile --      Pulse Rate 09/01/23 2019 94     Resp 09/01/23 2019 19     Temp 09/01/23 2019 98.6 F (37 C)     Temp Source 09/01/23 2019 Oral     SpO2 09/01/23 2019 99 %     Weight 09/01/23 2020 106 lb (48.1 kg)     Height 09/01/23 2020 5\' 3"  (1.6 m)     Head Circumference --      Peak Flow --      Pain Score 09/01/23 2021 8     Pain Loc --      Pain Education --      Exclude from Growth Chart --     Most recent vital signs: Vitals:   09/01/23 2019  BP: 121/85  Pulse: 94  Resp: 19  Temp: 98.6 F (37 C)  SpO2: 99%     General: Awake, no distress.  CV:  Good peripheral perfusion.  Resp:  Normal effort.  Abd:  No distention.  Other:  TMs are clear bilaterally without erythema, no obvious fluid behind the TMs.  No purulence in the ear canals.  No tenderness to external exam.   ED Results / Procedures / Treatments   Labs (all labs ordered are listed, but only abnormal results are displayed) Labs Reviewed - No data to display   PROCEDURES:  Critical Care performed:  No  Procedures   MEDICATIONS ORDERED IN ED: Medications - No data to display   IMPRESSION / MDM / ASSESSMENT AND PLAN / ED COURSE  I reviewed the triage vital signs and the nursing notes.                              Differential diagnosis includes, but is not limited to, constricted eustachian tubes, no evidence of infection at this time.  Recommended Tylenol, trying Zyrtec, using the nasal spray that was given by the ENT.  Will give her a number to call for her ENT surgeon on-call here to see if they take patients without insurance.  Otherwise no indication for additional workup at this time.  She is safe for outpatient management.  Will discharge with strict return precautions.  Patient's presentation is most consistent with acute complicated illness / injury requiring diagnostic workup.        FINAL CLINICAL IMPRESSION(S) / ED DIAGNOSES   Final  diagnoses:  Dysfunction of both eustachian tubes  Discomfort of both ears     Rx / DC Orders   ED Discharge Orders     None        Note:  This document was prepared using Dragon voice recognition software and may include unintentional dictation errors.    Claybon Jabs, MD 09/01/23 2051

## 2023-09-01 NOTE — Discharge Instructions (Signed)
 You can try taking Tylenol for the pain, 650 mg every 6 hours.  You can also try Zyrtec.  Please use the nasal spray as prescribed by your ENT surgeon.  I also give you a number to call for another ENT surgeon if you would like a second opinion.

## 2023-09-01 NOTE — ED Triage Notes (Signed)
 Pt reports left ear pain that began 1 month ago, pt denies cough congestion or fever,

## 2023-09-30 ENCOUNTER — Other Ambulatory Visit: Payer: Self-pay | Admitting: Otolaryngology

## 2023-09-30 DIAGNOSIS — H9202 Otalgia, left ear: Secondary | ICD-10-CM

## 2023-10-13 ENCOUNTER — Encounter: Payer: Self-pay | Admitting: Otolaryngology

## 2023-10-16 ENCOUNTER — Other Ambulatory Visit

## 2023-12-15 ENCOUNTER — Emergency Department
Admission: EM | Admit: 2023-12-15 | Discharge: 2023-12-15 | Disposition: A | Discharge status: Home / Self Care | Attending: Emergency Medicine | Admitting: Emergency Medicine

## 2023-12-15 ENCOUNTER — Other Ambulatory Visit: Payer: Self-pay

## 2023-12-15 ENCOUNTER — Encounter: Payer: Self-pay | Admitting: Emergency Medicine

## 2023-12-15 DIAGNOSIS — J029 Acute pharyngitis, unspecified: Secondary | ICD-10-CM | POA: Diagnosis present

## 2023-12-15 LAB — GROUP A STREP BY PCR: Group A Strep by PCR: NOT DETECTED

## 2023-12-15 MED ORDER — AMOXICILLIN-POT CLAVULANATE 875-125 MG PO TABS: 1.0000 | ORAL_TABLET | Freq: Two times a day (BID) | ORAL | 0 refills | Status: AC

## 2023-12-15 NOTE — ED Provider Notes (Signed)
 [facility] Provider Note    Event Date/Time   First MD Initiated Contact with Patient 12/15/23 1052     (approximate)   History   Sore Throat   HPI  Terry Wilkerson is a 38 y.o. female who presents today for evaluation of sore throat.  Patient reports that this began today.  No fevers or chills.  No cough or nasal congestion.  She is able to swallow without difficulty.  No voice change.  There are no active problems to display for this patient.         Physical Exam   Triage Vital Signs: ED Triage Vitals  Encounter Vitals Group     BP 12/15/23 1040 (!) 136/93     Systolic BP Percentile --      Diastolic BP Percentile --      Pulse Rate 12/15/23 1040 (!) 115     Resp 12/15/23 1040 17     Temp 12/15/23 1040 98.2 F (36.8 C)     Temp Source 12/15/23 1040 Oral     SpO2 12/15/23 1040 100 %     Weight 12/15/23 1038 108 lb (49 kg)     Height 12/15/23 1038 5\' 3"  (1.6 m)     Head Circumference --      Peak Flow --      Pain Score 12/15/23 1038 0     Pain Loc --      Pain Education --      Exclude from Growth Chart --     Most recent vital signs: Vitals:   12/15/23 1040  BP: (!) 136/93  Pulse: (!) 115  Resp: 17  Temp: 98.2 F (36.8 C)  SpO2: 100%    Physical Exam Vitals and nursing note reviewed.  Constitutional:      General: Awake and alert. No acute distress.    Appearance: Normal appearance. The patient is normal weight.  HENT:     Head: Normocephalic and atraumatic.     Mouth: Mucous membranes are moist. Uvula midline.  Bilateral tonsillar exudate.  No soft palate fluctuance.  No trismus.  No voice change.  No sublingual swelling.  Mild tender cervical lymphadenopathy.  No nuchal rigidity Eyes:     General: PERRL. Normal EOMs        Right eye: No discharge.        Left eye: No discharge.     Conjunctiva/sclera: Conjunctivae normal.  Cardiovascular:     Rate and Rhythm: Normal rate and regular rhythm.     Pulses: Normal  pulses.  Pulmonary:     Effort: Pulmonary effort is normal. No respiratory distress.     Breath sounds: Normal breath sounds.  Abdominal:     Abdomen is soft. There is no abdominal tenderness. No rebound or guarding. No distention. Musculoskeletal:        General: No swelling. Normal range of motion.     Cervical back: Normal range of motion and neck supple.  Skin:    General: Skin is warm and dry.     Capillary Refill: Capillary refill takes less than 2 seconds.     Findings: No rash.  Neurological:     Mental Status: The patient is awake and alert.      ED Results / Procedures / Treatments   Labs (all labs ordered are listed, but only abnormal results are displayed) Labs Reviewed  GROUP A STREP BY PCR     EKG     RADIOLOGY  PROCEDURES:  Critical Care performed:   Procedures   MEDICATIONS ORDERED IN ED: Medications - No data to display   IMPRESSION / MDM / ASSESSMENT AND PLAN / ED COURSE  I reviewed the triage vital signs and the nursing notes.   Differential diagnosis includes, but is not limited to, strep pharyngitis, viral pharyngitis, other URI.  Patient is awake and alert, tachycardic on arrival although mildly anxious.  No fever.  She does have tonsillar exudate bilaterally and mild tender cervical lymphadenopathy, and no cough.  Per [facility] criteria, will treat for strep pharyngitis, which patient would like to do as well.  We discussed that she is likely contagious to others.  We discussed return precautions and outpatient follow-up.  Patient understands and agrees with plan.  She was discharged in stable condition.  Patient's presentation is most consistent with acute complicated illness / injury requiring diagnostic workup.      FINAL CLINICAL IMPRESSION(S) / ED DIAGNOSES   Final diagnoses:  Pharyngitis, unspecified etiology     Rx / DC Orders   ED Discharge Orders          Ordered    amoxicillin -clavulanate (AUGMENTIN ) 875-125  MG tablet  2 times daily        12/15/23 1131             Note:  This document was prepared using Dragon voice recognition software and may include unintentional dictation errors.   Letta Cargile E, PA-C 12/15/23 1705    Viviano Ground, MD 12/21/23 1028

## 2023-12-15 NOTE — Discharge Instructions (Signed)
Please take the antibiotic as prescribed.  Please return for any new, worsening, or change in symptoms or other concerns

## 2023-12-15 NOTE — ED Triage Notes (Signed)
 Patient to ED via POV for sore throat. Ongoing x4 days. States she has white spots on throat.

## 2024-01-20 ENCOUNTER — Ambulatory Visit: Payer: Self-pay

## 2024-01-20 NOTE — Telephone Encounter (Signed)
 Copied from CRM [id number]. Topic: Clinical - Red Word Triage >> Jan 20, 2024  4:50 PM DeAngela L wrote: Red Word that prompted transfer to Nurse Triage: pt have been having numbness in her face on the left part of her jaw this is been feeling numb for almost 2 weeks the bump is grows and shrinks and cause more numbness when the bump grows  Pt num [id number] (M)    FYI Only or Action Required?: FYI only for provider.  Called Nurse Triage reporting [place].  Symptoms began 2 weeks ago.  Interventions attempted: Nothing.  Symptoms are: gradually worsening.  Triage Disposition: See Physician Within 24 Hours  Patient/caregiver understands and will follow disposition?: No appointment, going to urgent care for acute issue. New patient appointment scheduled.     Reason for Disposition  [1] Swelling is painful to touch AND [2] no fever  Answer Assessment - Initial Assessment Questions 1. APPEARANCE of SWELLING: What does it look like?     Little bump to the left side of jaw 2. SIZE: How large is the swelling? (e.g., inches, cm; or compare to size of pinhead, tip of pen, eraser, coin, pea, grape, ping pong ball)      Jelly bean, size goes up and down 3. LOCATION: Where is the swelling located?     Left side of jaw  4. ONSET: When did the swelling start?     2 weeks 5. COLOR: What color is it? Is there more than one color?     Skin colored  6. PAIN: Is there any pain? If Yes, ask: How bad is the pain? (Scale 1-10; or mild, moderate, severe)       4/10 when touched  7. ITCH: Does it itch? If Yes, ask: How bad is the itch?      No 8. CAUSE: What do you think caused the swelling?     Unsure  9 OTHER SYMPTOMS: Do you have any other symptoms? (e.g., fever)     Numbness to area of swelling  Protocols used: Skin Lump or Localized Swelling-A-AH

## 2024-03-07 ENCOUNTER — Ambulatory Visit (INDEPENDENT_AMBULATORY_CARE_PROVIDER_SITE_OTHER): Admitting: Family Medicine

## 2024-03-07 ENCOUNTER — Encounter: Payer: Self-pay | Admitting: Family Medicine

## 2024-03-07 VITALS — BP 114/76 | HR 99 | Ht 63.0 in | Wt 107.0 lb

## 2024-03-07 DIAGNOSIS — Z136 Encounter for screening for cardiovascular disorders: Secondary | ICD-10-CM

## 2024-03-07 DIAGNOSIS — M419 Scoliosis, unspecified: Secondary | ICD-10-CM | POA: Diagnosis not present

## 2024-03-07 DIAGNOSIS — Z0001 Encounter for general adult medical examination with abnormal findings: Secondary | ICD-10-CM | POA: Diagnosis not present

## 2024-03-07 DIAGNOSIS — R710 Precipitous drop in hematocrit: Secondary | ICD-10-CM

## 2024-03-07 DIAGNOSIS — F411 Generalized anxiety disorder: Secondary | ICD-10-CM | POA: Diagnosis not present

## 2024-03-07 DIAGNOSIS — M545 Low back pain, unspecified: Secondary | ICD-10-CM

## 2024-03-07 DIAGNOSIS — Z1159 Encounter for screening for other viral diseases: Secondary | ICD-10-CM

## 2024-03-07 DIAGNOSIS — Z114 Encounter for screening for human immunodeficiency virus [HIV]: Secondary | ICD-10-CM

## 2024-03-07 DIAGNOSIS — Z0000 Encounter for general adult medical examination without abnormal findings: Secondary | ICD-10-CM

## 2024-03-07 DIAGNOSIS — R1310 Dysphagia, unspecified: Secondary | ICD-10-CM

## 2024-03-07 DIAGNOSIS — E618 Deficiency of other specified nutrient elements: Secondary | ICD-10-CM

## 2024-03-07 DIAGNOSIS — R519 Headache, unspecified: Secondary | ICD-10-CM

## 2024-03-07 DIAGNOSIS — Z1321 Encounter for screening for nutritional disorder: Secondary | ICD-10-CM

## 2024-03-07 DIAGNOSIS — R202 Paresthesia of skin: Secondary | ICD-10-CM

## 2024-03-07 DIAGNOSIS — J302 Other seasonal allergic rhinitis: Secondary | ICD-10-CM

## 2024-03-07 DIAGNOSIS — Z79899 Other long term (current) drug therapy: Secondary | ICD-10-CM

## 2024-03-07 DIAGNOSIS — G8929 Other chronic pain: Secondary | ICD-10-CM

## 2024-03-07 DIAGNOSIS — Z130 Encounter for screening for diseases of the blood and blood-forming organs and certain disorders involving the immune mechanism: Secondary | ICD-10-CM

## 2024-03-07 DIAGNOSIS — M47816 Spondylosis without myelopathy or radiculopathy, lumbar region: Secondary | ICD-10-CM | POA: Insufficient documentation

## 2024-03-07 NOTE — Progress Notes (Signed)
 New patient visit   Patient: Terry Wilkerson   DOB: October 25, 1985   38 y.o. Female  MRN: [id number] Visit Date: 03/07/2024  Today's healthcare provider: LAURAINE LOISE BUOY, DO   Chief Complaint  Patient presents with   New Patient (Initial Visit)    No concerns    Subjective    Terry Wilkerson is a 38 y.o. female who presents today as a new patient to establish care.   HPI HPI     New Patient (Initial Visit)    Additional comments: No concerns       Last edited by Thelbert Eulalio HERO, CMA on 03/07/2024  9:28 AM.      Terry Wilkerson is a 38 year old female who presents to establish care and discuss vitamin levels. She is accompanied by her mother.  She experiences numbness primarily on her face and occasionally in the middle of her back, described as feeling like 'TENs machines'. This occurs approximately once a week. She attributes the numbness to her scoliosis, and her chiropractor suggested it might be due to the same. She also experiences balance issues and occasional back pain, which she believes may be related to her scoliosis. She engages in yoga and walking for exercise, with yoga sessions lasting about 30 minutes and walks lasting at least 10 minutes.  She has a history of swallowing difficulties that began after an illness she suspects might have been COVID-19. She can no longer swallow solid foods as they get stuck in her throat. Despite normal results from an endoscopy and swallowing studies, she continues to experience issues and has adapted her diet to include blended foods, soups, and smoothies. She takes a liquid multivitamin and protein powder to supplement her diet.  She reports occasional headaches associated with her back pain, occurring about once a month and lasting one to two days. She sometimes uses coffee to alleviate the tension.  She has a history of low hemoglobin and low potassium levels in the past, although these were noted several years ago. She takes a  multivitamin that includes DHA, liquid iron, magnesium, B12, vitamin C, ginger, and omega-3. She is unsure if it contains vitamin D or E.  Her family history includes heart issues, with her mother having had a heart attack at 38 Yo and her brothers having heart problems related to smoking and drinking. She quit smoking in 2009 after smoking four cigarettes a day for two years.     Past Medical History:  Diagnosis Date   Anxiety    Past Surgical History:  Procedure Laterality Date   CESAREAN SECTION  2010   Family Status  Relation Name Status   Mother  Alive   Father  Alive  No partnership data on file   History reviewed. No pertinent family history. Social History   Socioeconomic History   Marital status: Single    Spouse name: Not on file   Number of children: Not on file   Years of education: Not on file   Highest education level: Not on file  Occupational History   Not on file  Tobacco Use   Smoking status: Former    Current packs/day: 0.00    Average packs/day: 0.3 packs/day for 2.0 years (0.5 ttl pk-yrs)    Types: Cigarettes    Quit date: 2009    Years since quitting: 16.6   Smokeless tobacco: Never  Vaping Use   Vaping status: Never Used  Substance and Sexual Activity   Alcohol use: No  Drug use: Never   Sexual activity: Not Currently  Other Topics Concern   Not on file  Social History Narrative   Not on file   Social Drivers of Health   Financial Resource Strain: Low Risk  (03/07/2024)   Overall Financial Resource Strain (CARDIA)    Difficulty of Paying Living Expenses: Not very hard  Food Insecurity: No Food Insecurity (03/07/2024)   Hunger Vital Sign    Worried About Running Out of Food in the Last Year: Never true    Ran Out of Food in the Last Year: Never true  Transportation Needs: No Transportation Needs (03/07/2024)   PRAPARE - Administrator, Civil Service (Medical): No    Lack of Transportation (Non-Medical): No  Physical  Activity: Not on file  Stress: Stress Concern Present (03/07/2024)   [employer] of Occupational Health - Occupational Stress Questionnaire    Feeling of Stress: To some extent  Social Connections: Not on file   Outpatient Medications Prior to Visit  Medication Sig   Multiple Vitamin (MULTIVITAMIN) LIQD Take 5 mLs by mouth daily. [employer] Liquid Morning Multivitamin   [DISCONTINUED] hydrOXYzine  (ATARAX /VISTARIL ) 25 MG tablet Take 1 tablet (25 mg total) by mouth 3 (three) times daily as needed.   [DISCONTINUED] magic mouthwash w/lidocaine  SOLN Take 5 mLs by mouth 4 (four) times daily. Swish and slow swallow   No facility-administered medications prior to visit.   Allergies  Allergen Reactions   Ibuprofen Swelling    Throat swelling    Immunization History  Administered Date(s) Administered   Tdap 06/29/2020    Health Maintenance  Topic Date Due   HIV Screening  Never done   Hepatitis C Screening  Never done   Cervical Cancer Screening (HPV/Pap Cotest)  Never done   INFLUENZA VACCINE  10/11/2024 (Originally 02/12/2024)   HPV VACCINES (1 - 3-dose SCDM series) 03/07/2025 (Originally 06/20/2013)   COVID-19 Vaccine (1) 03/07/2025 (Originally 06/21/1991)   DTaP/Tdap/Td (2 - Td or Tdap) 06/29/2030   Pneumococcal Vaccine  Aged Out   Meningococcal B Vaccine  Aged Out   Hepatitis B Vaccines 19-59 Average Risk  Discontinued    Patient Care Team: Kairie Vangieson, Lauraine SAILOR, DO as PCP - General (Family Medicine)  Review of Systems  Constitutional:  Negative for chills, fatigue and fever.  HENT:  Negative for congestion, ear pain, rhinorrhea, sneezing and sore throat.   Eyes: Negative.  Negative for pain and redness.  Respiratory:  Negative for cough, shortness of breath and wheezing.   Cardiovascular:  Negative for chest pain and leg swelling.  Gastrointestinal:  Negative for abdominal pain, blood in stool, constipation, diarrhea, nausea and vomiting.  Endocrine: Negative for polydipsia,  polyphagia and polyuria.  Genitourinary: Negative.  Negative for dysuria, flank pain, hematuria, pelvic pain, vaginal bleeding and vaginal discharge.  Musculoskeletal:  Positive for back pain. Negative for arthralgias, gait problem and joint swelling.  Skin:  Negative for rash.  Neurological:  Positive for headaches (not frequent). Negative for dizziness, tremors, seizures, weakness, light-headedness and numbness.  Hematological:  Negative for adenopathy.  Psychiatric/Behavioral: Negative.  Negative for behavioral problems, confusion and dysphoric mood. The patient is not nervous/anxious and is not hyperactive.         Objective    BP 114/76   Pulse 99   Ht 5' 3 (1.6 m)   Wt 107 lb (48.5 kg)   SpO2 100%   BMI 18.95 kg/m     Physical Exam Vitals and nursing note reviewed.  Constitutional:      General: She is awake.     Appearance: Normal appearance.  HENT:     Head: Normocephalic and atraumatic.     Right Ear: Tympanic membrane, ear canal and external ear normal.     Left Ear: Tympanic membrane, ear canal and external ear normal.     Nose: Nose normal.     Mouth/Throat:     Mouth: Mucous membranes are moist.     Pharynx: Oropharynx is clear. No oropharyngeal exudate or posterior oropharyngeal erythema.  Eyes:     General: No scleral icterus.    Extraocular Movements: Extraocular movements intact.     Conjunctiva/sclera: Conjunctivae normal.     Pupils: Pupils are equal, round, and reactive to light.  Neck:     Thyroid: No thyromegaly or thyroid tenderness.  Cardiovascular:     Rate and Rhythm: Normal rate and regular rhythm.     Pulses: Normal pulses.     Heart sounds: Normal heart sounds.  Pulmonary:     Effort: Pulmonary effort is normal. No tachypnea, bradypnea or respiratory distress.     Breath sounds: Normal breath sounds. No stridor. No wheezing, rhonchi or rales.  Abdominal:     General: Bowel sounds are normal. There is no distension.     Palpations:  Abdomen is soft. There is no mass.     Tenderness: There is no abdominal tenderness. There is no guarding.     Hernia: No hernia is present.  Musculoskeletal:     Cervical back: Normal range of motion and neck supple.     Right lower leg: No edema.     Left lower leg: No edema.  Lymphadenopathy:     Cervical: No cervical adenopathy.  Skin:    General: Skin is warm and dry.  Neurological:     Mental Status: She is alert and oriented to person, place, and time. Mental status is at baseline.  Psychiatric:        Mood and Affect: Mood normal.        Behavior: Behavior normal.     Depression Screen    03/07/2024    9:37 AM  PHQ 2/9 Scores  PHQ - 2 Score 0  PHQ- 9 Score 0   No results found for any visits on 03/07/24.  Assessment & Plan     Encounter for medical examination to establish care  Dysphagia, unspecified type -     VITAMIN D 25 Hydroxy (Vit-D Deficiency, Fractures) -     Vitamin B12 -     Vitamin A -     Iron, TIBC and Ferritin Panel -     Zinc -     Magnesium -     Copper, serum  Generalized anxiety disorder  Scoliosis, unspecified scoliosis type, unspecified spinal region  Nonintractable episodic headache, unspecified headache type  Chronic midline low back pain without sciatica  Paresthesia -     VITAMIN D 25 Hydroxy (Vit-D Deficiency, Fractures) -     TSH Rfx on Abnormal to Free T4 -     Vitamin B12  Seasonal allergic rhinitis, unspecified trigger  Mineral deficiency -     Zinc -     Magnesium -     Copper, serum  Decreased hemoglobin -     CBC  Encounter for screening for cardiovascular disorders -     Lipid panel  Screening for endocrine, metabolic and immunity disorder -     Comprehensive metabolic panel with GFR -  TSH Rfx on Abnormal to Free T4  Encounter for hepatitis C screening test for low risk patient -     HCV Ab w Reflex to Quant PCR  Encounter for screening for HIV -     HIV Antibody (routine testing w  rflx)  Encounter for vitamin deficiency screening -     Vitamin A  High risk medication use -     Vitamin A     Encounter for medical examination to establish care Physical exam overall unremarkable except as noted above. Routine lab work ordered as noted.  Discussion of vaccinations and screenings. Due for HPV vaccine, flu shot, Pap smear. - Administer flu shot when available. - Schedule Pap smear at next visit. - Screen for HIV and hepatitis C with blood work.  Dysphagia to solid foods; generalized anxiety disorder Chronic dysphagia. Started prior to COVID-19 (first visits for problem were 09/2018 in record). History of full workup including barium swallow study, manometry and endoscopy.  Anxiety may contribute.  She reports she has also been seen by psych (exact capacity unclear (therapy vs. Psychiatry)) to deal with it from an anxiety perspective which has not helped. - Encouraged diet with blended fruits, vegetables, proteins. - Consider adding rice, beans and grains to meals - blended - Check for nutrient deficiencies including but not limited to iron, copper, magnesium, zinc, vitamin A levels.  Scoliosis with associated low back pain and paresthesia Scoliosis with low back pain and paresthesia, possibly nerve compression. Scoliosis appears mild. - Order thyroid level and vitamin B12 and vitamin D to rule out metabolic source of numbness. - Encourage continued yoga and walking. - Consider physical therapy if symptoms persist.  Seasonal allergic rhinitis Intermittent ear pain likely due to allergies, ENT evaluation normal. - Recommend trial of allergy medication for 2-4 weeks.  Headache Occasional headaches associated with back pain, relieved by coffee. - Consider headache diary to identify patterns.    Follow-Up Plan for follow-up visit for Pap smear and blood work. Discussed logistics for blood draw. - Schedule follow-up visit for Pap smear and blood work (if not  already done). - Consider local [facility] for blood draw if convenient.    Return in about 4 weeks (around 04/04/2024) for Pap, Chronic f/u.     I discussed the assessment and treatment plan with the patient  The patient was provided an opportunity to ask questions and all were answered. The patient agreed with the plan and demonstrated an understanding of the instructions.   The patient was advised to call back or seek an in-person evaluation if the symptoms worsen or if the condition fails to improve as anticipated.    LAURAINE LOISE BUOY, DO  [facility] [phone] (phone) [phone] (fax)  [facility]

## 2024-03-07 NOTE — Patient Instructions (Signed)
 Consider Claritin, Zyrtec or Allegra, take 1 tablet nightly for 2 to 4 weeks and reassess your ear pain/headaches.

## 2024-04-05 ENCOUNTER — Ambulatory Visit: Admitting: Family Medicine

## 2024-04-06 ENCOUNTER — Ambulatory Visit: Admitting: Family Medicine

## 2024-04-25 ENCOUNTER — Ambulatory Visit: Admitting: Family Medicine

## 2024-06-07 ENCOUNTER — Ambulatory Visit: Admitting: Physician Assistant

## 2024-06-16 ENCOUNTER — Ambulatory Visit

## 2024-07-28 ENCOUNTER — Ambulatory Visit: Payer: Self-pay

## 2024-07-28 NOTE — Telephone Encounter (Signed)
 FYI Only or Action Required?: Appointment scheduled for 1/23, she is seeking a morning appointment.  Patient was last seen in primary care on 03/07/2024 by Donzella Lauraine SAILOR, DO.  Called Nurse Triage reporting Facial Pain.  Symptoms began several months ago.  Interventions attempted: Ice/heat application.  Symptoms are: unchanged.  Triage Disposition: See PCP When Office is Open (Within 3 Days)  Patient/caregiver understands and will follow disposition?: Yes              Copied from CRM [id number]. Topic: Clinical - Red Word Triage >> Jul 28, 2024  2:50 PM Selinda RAMAN wrote: Red Word that prompted transfer to Nurse Triage: The patient called in stating she has been dealing with bad facial and ear pressure and discomfort for quite some time now. She previously went to the ENT but she still has the same issue its just that then it started in her nose but then moved to her ears and face near her ears. She says heat truly bothers her. I will transfer her to [facility] Reason for Disposition  [1] Ear congestion lasts > 3 days AND [2] no improvement after using Care Advice  (Exception: Ear congestion is a chronic symptom.)  Answer Assessment - Initial Assessment Questions 1. LOCATION: Which ear is involved?       BIL ear  2. SENSATION: Describe how the ear feels. (e.g., stuffy, full, plugged).      Plugged, full   3. ONSET:  When did the ear symptoms start?       June of 2025  4. PAIN: Do you also have an earache? If Yes, ask: How bad is it? (Scale 0-10; none, mild, moderate or severe)     No pain only discomfort   5. CAUSE: What do you think is causing the ear congestion? (e.g., common cold, nasal allergies, recent flight, recent snorkeling)     Unsure   6. OTHER SYMPTOMS: Do you have any other symptoms? (e.g., ear drainage, hay fever symptoms such as sneezing or a clear nasal discharge; cold symptoms such as a cough or runny nose)      Facial pressure, last night.    7. PREGNANCY: Is there any chance you are pregnant? When was your last menstrual period?     No      Patient called in to triage with complaints of BIL ear congestion. This has been ongoing since June of 2025 The patient stated she was seen by ENT for ear pressure back in June of 2025. For home care, the patient is using heat to relieve symptoms.   Appointment scheduled for further evaluation on 1/23 at 3:00 pm (patient seeking a morning appointment; added to the wait list) No morning appts. Available per Epic. Patient agrees with the plan of care, and will reach out if symptoms worsen or persist.  Protocols used: Ear - Congestion-A-AH

## 2024-08-05 ENCOUNTER — Ambulatory Visit: Admitting: Family Medicine

## 2024-08-10 ENCOUNTER — Other Ambulatory Visit: Payer: Self-pay

## 2024-08-10 ENCOUNTER — Emergency Department
Admission: EM | Admit: 2024-08-10 | Discharge: 2024-08-10 | Disposition: A | Discharge status: Home / Self Care | Payer: Self-pay | Attending: Emergency Medicine | Admitting: Emergency Medicine

## 2024-08-10 DIAGNOSIS — X58XXXA Exposure to other specified factors, initial encounter: Secondary | ICD-10-CM | POA: Insufficient documentation

## 2024-08-10 DIAGNOSIS — S00411A Abrasion of right ear, initial encounter: Secondary | ICD-10-CM | POA: Insufficient documentation

## 2024-08-10 MED ORDER — OFLOXACIN 0.3 % OT SOLN: 5.0000 [drp] | Freq: Two times a day (BID) | OTIC | 0 refills | Status: AC

## 2024-08-10 NOTE — Discharge Instructions (Signed)
 You may use the eardrop to help with your abrasion on your canal.  Please return for any new, worsening, or changing symptoms or other concerns.  It was a pleasure caring for you today.

## 2024-08-10 NOTE — ED Triage Notes (Signed)
 Pt reports she woke up this morning with blood coming out of her right ear, pt reports her hearing is a bit muffled on the right side. Pt denies pain to ear. Pt denies injury to ear or sticking anything in it recently.

## 2024-08-10 NOTE — ED Provider Notes (Signed)
 "  [facility] Provider Note    Event Date/Time   First MD Initiated Contact with Patient 08/10/24 [phone]     (approximate)   History   Ear Drainage   HPI  Terry Wilkerson is a 39 y.o. female with a past medical history of dysphagia, generalized anxiety disorder, scoliosis who presents today for evaluation of blood from her ear this morning.  She reports that she was itching her ear with her finger and noticed blood on her finger.  She reports that it was dark red and crusty.  She did not notice any blood or discharge on her pillow this morning.  She has not had any pain, fevers, or chills.  She has not stuck anything else in her ear.  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          Physical Exam   Triage Vital Signs: ED Triage Vitals  Encounter Vitals Group     BP 08/10/24 0614 (!) 151/91     Girls Systolic BP Percentile --      Girls Diastolic BP Percentile --      Boys Systolic BP Percentile --      Boys Diastolic BP Percentile --      Pulse Rate 08/10/24 0614 (!) 119     Resp 08/10/24 0614 18     Temp 08/10/24 0614 97.8 F (36.6 C)     Temp src --      SpO2 08/10/24 0614 100 %     Weight 08/10/24 0612 108 lb (49 kg)     Height 08/10/24 0612 5' 3 (1.6 m)     Head Circumference --      Peak Flow --      Pain Score 08/10/24 0612 0     Pain Loc --      Pain Education --      Exclude from Growth Chart --     Most recent vital signs: Vitals:   08/10/24 0716 08/10/24 0744  BP:  135/89  Pulse:  100  Resp:  19  Temp:  98 F (36.7 C)  SpO2: 95% 100%    Physical Exam Vitals and nursing note reviewed.  Constitutional:      General: Awake and alert. No acute distress.    Appearance: Normal appearance. The patient is normal weight.  HENT:     Head: Normocephalic and atraumatic.     Mouth: Mucous membranes are moist.  TMs visualized bilaterally.  Right ear  with wet cerumen noted along the margins of the canal, though TM is easily visualized.  There is a superficial abrasion noted to the canal, no active bleeding.  No retained foreign body.  Normal pinna.  Normal mastoid. Eyes:     General: PERRL. Normal EOMs        Right eye: No discharge.        Left eye: No discharge.     Conjunctiva/sclera: Conjunctivae normal.  Cardiovascular:     Rate and Rhythm: Normal rate.     Pulses: Normal pulses.  Pulmonary:     Effort: Pulmonary effort is normal. No respiratory distress.     Breath sounds: Normal breath sounds.  Abdominal:     Abdomen is soft. There is no abdominal tenderness.  Musculoskeletal:        General: No swelling. Normal range of motion.     Cervical back: Normal range of motion  and neck supple.  Skin:    General: Skin is warm and dry.     Capillary Refill: Capillary refill takes less than 2 seconds.     Findings: No rash.  Neurological:     Mental Status: The patient is awake and alert.      ED Results / Procedures / Treatments   Labs (all labs ordered are listed, but only abnormal results are displayed) Labs Reviewed - No data to display   EKG     RADIOLOGY     PROCEDURES:  Critical Care performed:   Procedures   MEDICATIONS ORDERED IN ED: Medications - No data to display   IMPRESSION / MDM / ASSESSMENT AND PLAN / ED COURSE  I reviewed the triage vital signs and the nursing notes.   Differential diagnosis includes, but is not limited to, cerumen impaction, perforated tympanic membrane, canal abrasion.  Patient is awake and alert, tachycardic on arrival though anxious in appearance.  Upon recheck this had normalized without intervention.  Patient does have cerumen in bilateral ears, though only lining the canal, no impaction.  The TM is easily visualized bilaterally and does not appear to be any perforation.  There is no bulging or erythema noted to the tympanic membranes.  She does have a superficial  abrasion probably from scratching her ear canal, which is possibly where she noticed the blood.  It is also possible that she noticed is merely cerumen.  We discussed symptomatic management and return precautions.  Given the abrasion, she was given ofloxacin  drops to help prevent infection.  We discussed return precautions and she was given the appropriate follow-up information for ENT.  Patient understands and agrees with plan.  Discharged in stable condition.   Patient's presentation is most consistent with acute complicated illness / injury requiring diagnostic workup.      FINAL CLINICAL IMPRESSION(S) / ED DIAGNOSES   Final diagnoses:  Ear canal abrasion, right, initial encounter     Rx / DC Orders   ED Discharge Orders          Ordered    ofloxacin  (FLOXIN ) 0.3 % OTIC solution  2 times daily        08/10/24 0739             Note:  This document was prepared using Dragon voice recognition software and may include unintentional dictation errors.   Miles Leyda E, PA-C 08/10/24 1248    Ernest Ronal BRAVO, MD 08/10/24 1454  "
